# Patient Record
Sex: Female | Born: 1980 | Race: White | Hispanic: No | Marital: Married | Smoking: Never smoker
Health system: Southern US, Community
[De-identification: ages and names within clinical notes are randomized; demographics above are authoritative.]

## PROBLEM LIST (undated history)

## (undated) DIAGNOSIS — N200 Calculus of kidney: Secondary | ICD-10-CM

## (undated) DIAGNOSIS — N943 Premenstrual tension syndrome: Secondary | ICD-10-CM

## (undated) DIAGNOSIS — O020 Blighted ovum and nonhydatidiform mole: Secondary | ICD-10-CM

## (undated) DIAGNOSIS — J45909 Unspecified asthma, uncomplicated: Secondary | ICD-10-CM

## (undated) HISTORY — DX: Premenstrual tension syndrome: N94.3

## (undated) HISTORY — PX: KNEE ARTHROSCOPY: SHX127

## (undated) HISTORY — DX: Calculus of kidney: N20.0

## (undated) HISTORY — PX: PILONIDAL CYST DRAINAGE: SHX743

---
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
## 2001-05-24 ENCOUNTER — Ambulatory Visit (HOSPITAL_COMMUNITY): Admission: RE | Admit: 2001-05-24 | Discharge: 2001-05-24 | Payer: Self-pay | Admitting: General Surgery

## 2001-05-24 ENCOUNTER — Encounter (INDEPENDENT_AMBULATORY_CARE_PROVIDER_SITE_OTHER): Payer: Self-pay | Admitting: Specialist

## 2001-10-06 ENCOUNTER — Emergency Department (HOSPITAL_COMMUNITY): Admission: EM | Admit: 2001-10-06 | Discharge: 2001-10-06 | Payer: Self-pay | Admitting: *Deleted

## 2004-04-25 ENCOUNTER — Other Ambulatory Visit: Admission: RE | Admit: 2004-04-25 | Discharge: 2004-04-25 | Payer: Self-pay | Admitting: Obstetrics and Gynecology

## 2005-07-18 ENCOUNTER — Other Ambulatory Visit: Admission: RE | Admit: 2005-07-18 | Discharge: 2005-07-18 | Payer: Self-pay | Admitting: Obstetrics and Gynecology

## 2008-03-19 ENCOUNTER — Inpatient Hospital Stay (HOSPITAL_COMMUNITY): Admission: AD | Admit: 2008-03-19 | Discharge: 2008-03-21 | Payer: Self-pay | Admitting: Obstetrics and Gynecology

## 2010-09-23 ENCOUNTER — Ambulatory Visit
Admission: RE | Admit: 2010-09-23 | Discharge: 2010-09-23 | Disposition: A | Discharge status: Home / Self Care | Payer: BC Managed Care – PPO | Source: Ambulatory Visit | Attending: Internal Medicine | Admitting: Internal Medicine

## 2010-09-23 ENCOUNTER — Other Ambulatory Visit: Payer: Self-pay | Admitting: Internal Medicine

## 2010-09-23 DIAGNOSIS — R059 Cough, unspecified: Secondary | ICD-10-CM

## 2010-09-23 DIAGNOSIS — R05 Cough: Secondary | ICD-10-CM

## 2010-12-27 NOTE — Discharge Summary (Signed)
Mercedes Diaz, Mercedes Diaz                ACCOUNT NO.:  [id number]   MEDICAL RECORD NO.:  [id number]          PATIENT TYPE:  INP   LOCATION:  9119                          FACILITY:  [facility]   PHYSICIAN:  Huel Cote, M.D. DATE OF BIRTH:  January 13, 1981   DATE OF ADMISSION:  03/19/2008  DATE OF DISCHARGE:  03/21/2008                               DISCHARGE SUMMARY   DISCHARGE DIAGNOSES:  1. Term pregnancy at 40 weeks delivery.  2. Status post normal spontaneous vaginal delivery.   DISCHARGE MEDICATIONS:  1. Tylenol p.r.n. pain.  2. Prenatal vitamins 1 p.o. daily.   DISCHARGE FOLLOWUP:  The patient is to follow up in the office in 6  weeks for her routine postpartum exam.   The patient is a 30 year old G1, P0, who came in at 40 plus weeks for  elective induction due to favorable cervix.  Her estimated date has been  March 18, 2008, by good dating, and her prenatal care was otherwise  uncomplicated.   Prenatal labs are as follows.  A positive, antibody negative, RPR  nonreactive, rubella immune, hepatitis B surface antigen, negative HIV,  negative GC, negative Chlamydia negative, 1-hour glucose tolerance 141  and 3-hour within normal limits.  Group B strep negative.   PAST MEDICAL HISTORY:  Nephrolithiasis.   PAST SURGICAL HISTORY:  Pilonidal cyst surgery x2 and a left knee  arthroscopy x2.   ALLERGIES:  Included IBUPROFEN which caused bloody stools.   MEDICATIONS:  None.   On physical exam, she was afebrile, stable vital signs.  Cervix was 3-4,  70, and -1 station.  She had rupture of membranes performed with clear  fluid noted and was placed on Pitocin.  She continued to progress and  reached complete dilation, pushed well, and had a normal spontaneous  vaginal delivery of a viable female infant.  Apgars were 9 and 9.  Weight  was 7 pounds 12 ounces.  Placenta delivered spontaneously.  Secondary  perineal laceration was repaired with 3-0 Vicryl and a left vaginal  laceration.  She  was then admitted for routine postpartum care and did  quite well.  On postpartum day #1, her hemoglobin was 11.3.  On  postpartum day #2, she was stable, tolerating her pain with Tylenol only  and was felt stable for discharge home.     Huel Cote, M.D.  Electronically Signed    KR/MEDQ  D:  03/21/2008  T:  03/22/2008  Job:  [id number]

## 2010-12-30 NOTE — Discharge Summary (Signed)
[facility]  Patient:    Mercedes Diaz, Mercedes Diaz                      MRN: [id number] Adm. Date:  [id number] Disc. Date: [id number] Attending:  Tammi Sou Dictator:   Ocie Doyne, M.D. CC:         Mardene Celeste. Lurene Shadow, M.D.   Discharge Summary  DATE OF BIRTH:  10-11-1980.  DISCHARGE DIAGNOSIS:  Pilonidal cyst without abscess.  DISCHARGE MEDICATIONS:  Augmentin 250 mg t.i.d. x 5 days and hydrocodone 1-2 tabs q.4-6h. p.r.n. pain.  PROCEDURES:  Excision and drainage of pilonidal abscess.  ADMISSION HISTORY AND PHYSICAL:  This 30 year old female presented today with maternity admission, complaining of sudden onset of right-sided pain, so severe that she had to lie down on the floor.  She vomited x 1.  She denied vaginal discharge.  Her mother has a history of repeated kidney stones and she stated that the pain was somewhat better than it had been earlier that morning.  PAST MEDICAL HISTORY:  None.  She has never had a pelvic exam.  MEDICATIONS:  Advil p.r.n.  ALLERGIES:  No known drug allergies.  SOCIAL HISTORY:  She smokes half a pack a day, occasional alcohol.  Denies illicit drug use.  She is a Consulting civil engineer at [employer] and it has been one year since her last sexual activity.  PHYSICAL EXAMINATION:  VITAL SIGNS:  Temperature 96.9, pulse 82, respirations 18, blood pressure 111/72.  GENERAL:  This is an adult white female in no acute distress.  HEENT:  Pupils are equal, round and reactive to light, accommodating. Tympanic membranes clear bilaterally.  LUNGS:  Clear to auscultation bilaterally.  NECK:  Supple, no lymphadenopathy.  HEART:  Regular rate and rhythm, no murmur.  BACK:  Right CVA tenderness.  There was a cyst in the pilonidal area which the patient stated had been there for a long time.  ABDOMEN:  Soft with mild midline tenderness.  PELVIC:  External genitalia with dried discharge, vagina small amount of  white discharge.  Cervix clear, no cervical motion tenderness, no adnexal tenderness, no masses.  Uterus was nontender.  RECTAL EXAM:  Deferred.  SKIN:  At the top of the buttocks there was a draining malodorous cyst, 1.5 to 2 cm in diameter.  ADMISSION LABORATORY DATA:  A urine pregnancy test was negative.  HOSPITAL COURSE:  [name] was admitted for evaluation of possible urolithiasis and hydronephrosis.  She continued to have sharp right flank pain.  Her urine was strained.  She denied a feeling of passing a stone while urinating and no stones were recovered from the urine.  She denied any dysuria.  Urinalysis showed large amounts of hemoglobin and was positive for nitrites and leukocyte esterase.  Her pain was adequately controlled on oral pain medication. She remained afebrile and stated she was interested in following up with her mothers urologist, who had been treating her mother for renal stones in the past.  While in the hospital she underwent drainage of her pilonidal cyst, as well, without complication.  CONDITION ON DISCHARGE:  Good.  FOLLOW-UP:  Follow up will be with Dr. Lurene Shadow.  The patient will schedule that appointment.  Additionally, she will schedule an appointment with her preferred urologist within the next month. DD:  06/27/00 TD:  06/28/00 Job: [id number]

## 2010-12-30 NOTE — Op Note (Signed)
[place]  Patient:    Mercedes Diaz, Mercedes Diaz Visit Number: [id number] MRN: [id number]          Service Type: DSU Location: [facility] 2899 [id number] Attending Physician:  Sonda Primes Dictated by:   Mardene Celeste Lurene Shadow, M.D. Proc. Date: 05/24/01 Admit Date:  05/24/2001 Discharge Date: 05/24/2001                             Operative Report  PREOPERATIVE DIAGNOSIS:  Recurrent pilonidal cyst.  POSTOPERATIVE DIAGNOSIS:  Recurrent pilonidal cyst.  PROCEDURE:  Excision and marsupialization of recurrent pilonidal cyst.  SURGEON:  Luisa Hart L. Lurene Shadow, M.D.  ASSISTANT:  Nurse.  ANESTHESIA:  General.  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  She initially did well, subsequently had recurrent drainage in the presacral region.  This was treated conservatively; however, she continued to have recurrent infection.  She was brought back to the operating room now for re-excision of her pilonidal cyst.  DESCRIPTION OF PROCEDURE:  Following the induction of satisfactory anesthesia, patient positioned in a prone jackknife position and the perianal tissues were prepped and draped to be included in the sterile operative field.  I used an electrocautery machine to excise a long pilonidal cyst and sinus, which extended from the tip of the coccyx up to the midsacrum.  The sinus and cyst were excised in their entirety.  The base of the wound was then curetted and the dermis was marsupialized down to presacral fascia using multiple interrupted sutures of 0 chromic catgut.  The wound was then packed with Xeroform gauze, sterile dressings applied, the anesthetic reversed, and the patient removed from the operating room to the recovery room in stable condition.  She tolerated the procedure well. Dictated by:   Mardene Celeste. Lurene Shadow, M.D. Attending Physician:  Sonda Primes DD:  05/24/01 TD:  05/24/01 Job: [id number]

## 2011-05-12 LAB — CBC
HCT: 37.4
Hemoglobin: 12.6
MCHC: 33.7
MCHC: 34.3
MCV: 88.1
MCV: 88.8
Platelets: 191
RBC: 3.73 — ABNORMAL LOW
RBC: 4.21
RDW: 14.3
WBC: 8.9

## 2011-05-12 LAB — CCBB MATERNAL DONOR DRAW

## 2011-11-01 ENCOUNTER — Ambulatory Visit
Admission: RE | Admit: 2011-11-01 | Discharge: 2011-11-01 | Disposition: A | Discharge status: Home / Self Care | Payer: BC Managed Care – PPO | Source: Ambulatory Visit | Attending: Family Medicine | Admitting: Family Medicine

## 2011-11-01 ENCOUNTER — Other Ambulatory Visit: Payer: Self-pay | Admitting: Family Medicine

## 2011-11-01 DIAGNOSIS — H539 Unspecified visual disturbance: Secondary | ICD-10-CM

## 2011-11-01 MED ORDER — IOHEXOL 300 MG/ML  SOLN
75.0000 mL | Freq: Once | INTRAMUSCULAR | Status: AC | PRN
  Administered 2011-11-01: 75 mL via INTRAVENOUS

## 2013-03-13 ENCOUNTER — Encounter (HOSPITAL_COMMUNITY): Payer: Self-pay | Admitting: Obstetrics and Gynecology

## 2013-03-14 ENCOUNTER — Encounter (HOSPITAL_COMMUNITY): Payer: Self-pay | Admitting: Anesthesiology

## 2013-03-14 ENCOUNTER — Ambulatory Visit (HOSPITAL_COMMUNITY): Payer: BC Managed Care – PPO | Admitting: Anesthesiology

## 2013-03-14 ENCOUNTER — Encounter (HOSPITAL_COMMUNITY)
Admission: RE | Disposition: A | Discharge status: Home / Self Care | Payer: Self-pay | Source: Ambulatory Visit | Attending: Obstetrics and Gynecology

## 2013-03-14 ENCOUNTER — Encounter (HOSPITAL_COMMUNITY): Payer: Self-pay | Admitting: Pharmacy Technician

## 2013-03-14 ENCOUNTER — Ambulatory Visit (HOSPITAL_COMMUNITY)
Admission: RE | Admit: 2013-03-14 | Discharge: 2013-03-14 | Disposition: A | Discharge status: Home / Self Care | Payer: BC Managed Care – PPO | Source: Ambulatory Visit | Attending: Obstetrics and Gynecology | Admitting: Obstetrics and Gynecology

## 2013-03-14 ENCOUNTER — Encounter (HOSPITAL_COMMUNITY): Payer: Self-pay | Admitting: *Deleted

## 2013-03-14 DIAGNOSIS — O021 Missed abortion: Secondary | ICD-10-CM

## 2013-03-14 HISTORY — PX: DILATION AND EVACUATION: SHX1459

## 2013-03-14 HISTORY — DX: Unspecified asthma, uncomplicated: J45.909

## 2013-03-14 LAB — CBC
HCT: 40.3 % (ref 36.0–46.0)
Hemoglobin: 13.6 g/dL (ref 12.0–15.0)
MCH: 28.8 pg (ref 26.0–34.0)
MCHC: 33.7 g/dL (ref 30.0–36.0)
MCV: 85.2 fL (ref 78.0–100.0)
Platelets: 230 10*3/uL (ref 150–400)
RBC: 4.73 MIL/uL (ref 3.87–5.11)
RDW: 13.7 % (ref 11.5–15.5)
WBC: 6.5 10*3/uL (ref 4.0–10.5)

## 2013-03-14 SURGERY — DILATION AND EVACUATION, UTERUS
Anesthesia: Monitor Anesthesia Care | Site: Vagina | Wound class: Clean Contaminated

## 2013-03-14 MED ORDER — ONDANSETRON HCL 4 MG/2ML IJ SOLN
INTRAMUSCULAR | Status: DC | PRN
  Administered 2013-03-14: 4 mg via INTRAVENOUS

## 2013-03-14 MED ORDER — 0.9 % SODIUM CHLORIDE (POUR BTL) OPTIME
TOPICAL | Status: DC | PRN
  Administered 2013-03-14: 1000 mL

## 2013-03-14 MED ORDER — LIDOCAINE HCL 2 % IJ SOLN
INTRAMUSCULAR | Status: DC | PRN
  Administered 2013-03-14: 16 mL

## 2013-03-14 MED ORDER — LIDOCAINE HCL (CARDIAC) 20 MG/ML IV SOLN
INTRAVENOUS | Status: AC
  Filled 2013-03-14: qty 5

## 2013-03-14 MED ORDER — PROPOFOL 10 MG/ML IV EMUL
INTRAVENOUS | Status: AC
  Filled 2013-03-14: qty 20

## 2013-03-14 MED ORDER — FENTANYL CITRATE 0.05 MG/ML IJ SOLN: 25.0000 ug | INTRAMUSCULAR | Status: DC | PRN

## 2013-03-14 MED ORDER — ONDANSETRON HCL 4 MG/2ML IJ SOLN
INTRAMUSCULAR | Status: AC
  Filled 2013-03-14: qty 2

## 2013-03-14 MED ORDER — MIDAZOLAM HCL 2 MG/2ML IJ SOLN
INTRAMUSCULAR | Status: AC
  Filled 2013-03-14: qty 2

## 2013-03-14 MED ORDER — LIDOCAINE HCL 2 % IJ SOLN
INTRAMUSCULAR | Status: AC
  Filled 2013-03-14: qty 20

## 2013-03-14 MED ORDER — PROPOFOL 10 MG/ML IV EMUL
INTRAVENOUS | Status: DC | PRN
  Administered 2013-03-14: 30 mg via INTRAVENOUS
  Administered 2013-03-14: 20 mg via INTRAVENOUS
  Administered 2013-03-14 (×3): 30 mg via INTRAVENOUS

## 2013-03-14 MED ORDER — LACTATED RINGERS IV SOLN
INTRAVENOUS | Status: DC
  Administered 2013-03-14: 09:00:00 via INTRAVENOUS

## 2013-03-14 MED ORDER — FENTANYL CITRATE 0.05 MG/ML IJ SOLN
INTRAMUSCULAR | Status: DC | PRN
  Administered 2013-03-14 (×2): 50 ug via INTRAVENOUS

## 2013-03-14 MED ORDER — MIDAZOLAM HCL 5 MG/5ML IJ SOLN
INTRAMUSCULAR | Status: DC | PRN
  Administered 2013-03-14: 2 mg via INTRAVENOUS

## 2013-03-14 MED ORDER — DOXYCYCLINE HYCLATE 100 MG IV SOLR
100.0000 mg | Freq: Once | INTRAVENOUS | Status: AC
  Administered 2013-03-14: 100 mg via INTRAVENOUS
  Filled 2013-03-14: qty 100

## 2013-03-14 MED ORDER — FENTANYL CITRATE 0.05 MG/ML IJ SOLN
INTRAMUSCULAR | Status: AC
  Filled 2013-03-14: qty 2

## 2013-03-14 MED ORDER — LACTATED RINGERS IV SOLN: INTRAVENOUS | Status: DC

## 2013-03-14 SURGICAL SUPPLY — 19 items
CATH ROBINSON RED A/P 16FR (CATHETERS) ×2 IMPLANT
CLOTH BEACON ORANGE TIMEOUT ST (SAFETY) ×2 IMPLANT
DECANTER SPIKE VIAL GLASS SM (MISCELLANEOUS) ×2 IMPLANT
GLOVE BIO SURGEON STRL SZ8 (GLOVE) ×2 IMPLANT
GLOVE ORTHO TXT STRL SZ7.5 (GLOVE) ×2 IMPLANT
GOWN STRL REIN XL XLG (GOWN DISPOSABLE) ×4 IMPLANT
KIT BERKELEY 1ST TRIMESTER 3/8 (MISCELLANEOUS) ×2 IMPLANT
NEEDLE SPNL 22GX3.5 QUINCKE BK (NEEDLE) ×2 IMPLANT
NS IRRIG 1000ML POUR BTL (IV SOLUTION) ×2 IMPLANT
PACK VAGINAL MINOR WOMEN LF (CUSTOM PROCEDURE TRAY) ×2 IMPLANT
PAD OB MATERNITY 4.3X12.25 (PERSONAL CARE ITEMS) ×2 IMPLANT
PAD PREP 24X48 CUFFED NSTRL (MISCELLANEOUS) ×2 IMPLANT
SET BERKELEY SUCTION TUBING (SUCTIONS) ×2 IMPLANT
SYR CONTROL 10ML LL (SYRINGE) ×2 IMPLANT
TOWEL OR 17X24 6PK STRL BLUE (TOWEL DISPOSABLE) ×4 IMPLANT
VACURETTE 10 RIGID CVD (CANNULA) IMPLANT
VACURETTE 7MM CVD STRL WRAP (CANNULA) IMPLANT
VACURETTE 8 RIGID CVD (CANNULA) ×2 IMPLANT
VACURETTE 9 RIGID CVD (CANNULA) IMPLANT

## 2013-03-14 NOTE — Op Note (Signed)
  Preoperative Diagnosis:  Missed Abortion Postop Diagnosis:  Missed Abortion Procedure:  D&E Anesthesia:  MAC, paracervical block Findings: Cervix was closed, uterus was normal size, abundant products of conception were obtained Specimens: Products of conception sent for routine pathology Estimated blood loss: Minimal Complications: None  Procedure in detail: The patient was taken to the operating room and placed in the dorsosupine position. IV sedation was given and she was placed in mobile stirrups. Perineum and vagina were prepped and draped in the usual sterile fashion, bladder drained with a red Robinson catheter. A Graves speculum was inserted in the vagina. The anterior lip of the cervix was grasped with a single-tooth tenaculum. Paracervical block was then performed with a total of 16 cc of 2% plain lidocaine. Uterus then sounded to 9 cm. Cervix was gradually easily dilated to size 25 dilator. A size 8 curved suction curet was then inserted without difficulty. Suction curettage was return was performed with return of abundant products of conception. Sharp curettage was performed with which revealed good uterine cry in all quadrants and no significant tissue. Suction curettage was performed one more time which revealed minimal blood. The single-tooth tenaculum was removed from the cervix and bleeding was controlled with pressure. All instruments were removed from the vagina. The patient was taken to the recovery in stable condition after tolerating the procedure well. Counts were correct, she received doxycycline at the beginning of the procedure and had PAS hose on throughout the procedure.

## 2013-03-14 NOTE — Transfer of Care (Signed)
Immediate Anesthesia Transfer of Care Note  Patient: Mercedes Diaz  Procedure(s) Performed: Procedure(s): DILATATION AND EVACUATION (N/A)  Patient Location: PACU  Anesthesia Type:MAC  Level of Consciousness: awake  Airway & Oxygen Therapy: Patient Spontanous Breathing  Post-op Assessment: Report given to PACU RN and Post -op Vital signs reviewed and stable  Post vital signs: Reviewed and stable  Complications: No apparent anesthesia complications

## 2013-03-14 NOTE — Anesthesia Postprocedure Evaluation (Signed)
  Anesthesia Post-op Note  Patient: Mercedes Diaz  Procedure(s) Performed: Procedure(s): DILATATION AND EVACUATION (N/A) Patient is awake and responsive. Pain and nausea are reasonably well controlled. Vital signs are stable and clinically acceptable. Oxygen saturation is clinically acceptable. There are no apparent anesthetic complications at this time. Patient is ready for discharge.

## 2013-03-14 NOTE — Interval H&P Note (Signed)
History and Physical Interval Note:  03/14/2013 8:47 AM  Mercedes Diaz  has presented today for surgery, with the diagnosis of missed ab, [id number]  The various methods of treatment have been discussed with the patient and family. After consideration of risks, benefits and other options for treatment, the patient has consented to  Procedure(s): DILATATION AND EVACUATION (N/A) as a surgical intervention .  The patient's history has been reviewed, patient examined, no change in status, stable for surgery.  I have reviewed the patient's chart and labs.  Questions were answered to the patient's satisfaction.     [name]

## 2013-03-14 NOTE — Preoperative (Signed)
Beta Blockers   Reason not to administer Beta Blockers:Not Applicable 

## 2013-03-14 NOTE — H&P (Signed)
Mercedes Diaz is an 32 y.o. female. She was seen in the office yesterday for a new OB workup, unable to hear FHT, u/s confirmed fetal demise.  Had u/s on 7-3, viable IUP at 6 weeks with +FHT.  U/s from yesterday with CRL c/w 7+ weeks, no FHT.  Options discussed, pt wishes to proceed with D&E.  Her blood type is A positive.  Pertinent Gynecological History: OB History: G2, P1011   Menstrual History:  Patient's last menstrual period was 12/11/2012.    Past Medical History  Diagnosis Date  . Asthma     with cold symptoms     Past Surgical History  Procedure Laterality Date  . Knee arthroscopy    . Pilonidal cyst drainage      History reviewed. No pertinent family history.  Social History:  reports that she has never smoked. She has never used smokeless tobacco. She reports that she does not drink alcohol or use illicit drugs.  Allergies:  Allergies  Allergen Reactions  . Ibuprofen Other (See Comments)    Rectal bleeding    Prescriptions prior to admission  Medication Sig Dispense Refill  . BuPROPion HCl (WELLBUTRIN PO) Take 1 tablet by mouth daily.      . Prenatal Vit-Fe Fumarate-FA (PRENATAL MULTIVITAMIN) TABS Take 1 tablet by mouth daily at 12 noon.        Review of Systems  Respiratory: Negative.   Cardiovascular: Negative.   Gastrointestinal: Negative.   Genitourinary: Negative.     Blood pressure 118/70, pulse 86, temperature 98.2 F (36.8 C), temperature source Oral, resp. rate 18, height 5\' 6"  (1.676 m), weight 81.647 kg (180 lb), last menstrual period 12/11/2012, SpO2 98.00%. Physical Exam  Constitutional: She appears well-developed and well-nourished.  Cardiovascular: Normal rate, regular rhythm and normal heart sounds.   No murmur heard. Respiratory: Effort normal and breath sounds normal. No respiratory distress. She has no wheezes.  GI: Soft. She exhibits no distension and no mass. There is no tenderness.  Genitourinary: Vagina normal.  Uterus slightly  enlarged, cervix closed No adnexal mass    Results for orders placed during the hospital encounter of 03/14/13 (from the past 24 hour(s))  CBC     Status: None   Collection Time    03/14/13  8:04 AM      Result Value Range   WBC 6.5  4.0 - 10.5 K/uL   RBC 4.73  3.87 - 5.11 MIL/uL   Hemoglobin 13.6  12.0 - 15.0 g/dL   HCT 16.1  09.6 - 04.5 %   MCV 85.2  78.0 - 100.0 fL   MCH 28.8  26.0 - 34.0 pg   MCHC 33.7  30.0 - 36.0 g/dL   RDW 40.9  81.1 - 91.4 %   Platelets 230  150 - 400 K/uL    No results found.  Assessment/Plan: Missed abortion at 7+ weeks.  Medical and surgical options discussed, pt wishes to proceed with D&E.  Procedure and risks discussed, will proceed with D&E.    Daniyla Pfahler D 03/14/2013, 8:14 AM

## 2013-03-14 NOTE — Anesthesia Preprocedure Evaluation (Signed)
Anesthesia Evaluation  Patient identified by MRN, date of birth, ID band Patient awake    Reviewed: Allergy & Precautions, H&P , Patient's Chart, lab work & pertinent test results, reviewed documented beta blocker date and time   Airway Mallampati: II TM Distance: >3 FB Neck ROM: full    Dental no notable dental hx.    Pulmonary  breath sounds clear to auscultation  Pulmonary exam normal       Cardiovascular Rhythm:regular Rate:Normal     Neuro/Psych    GI/Hepatic   Endo/Other    Renal/GU      Musculoskeletal   Abdominal   Peds  Hematology   Anesthesia Other Findings   Reproductive/Obstetrics                           Anesthesia Physical Anesthesia Plan  ASA: II  Anesthesia Plan: MAC   Post-op Pain Management:    Induction: Intravenous  Airway Management Planned: LMA, Mask and Natural Airway  Additional Equipment:   Intra-op Plan:   Post-operative Plan:   Informed Consent: I have reviewed the patients History and Physical, chart, labs and discussed the procedure including the risks, benefits and alternatives for the proposed anesthesia with the patient or authorized representative who has indicated his/her understanding and acceptance.   Dental Advisory Given  Plan Discussed with: CRNA and Surgeon  Anesthesia Plan Comments:         Anesthesia Quick Evaluation  

## 2013-03-17 ENCOUNTER — Encounter (HOSPITAL_COMMUNITY): Payer: Self-pay | Admitting: Obstetrics and Gynecology

## 2014-08-14 NOTE — L&D Delivery Note (Signed)
Delivery Note At 3:28 PM a viable female was delivered via Vaginal, Spontaneous Delivery (Presentation: vtx; Occiput Posterior).  APGAR: 9, 9; weight pending.   Placenta status: Intact, Spontaneous.  Cord: 3 vessels with the following complications: None.  Anesthesia: Epidural  Episiotomy: None Lacerations: 3rd degree Suture Repair: 2.0 vicryl for EAS, 3-0 Vicryl rapide for remainder Est. Blood Loss (mL): 282  Mom to postpartum.  Baby to Couplet care / Skin to Skin.  Irlene Crudup D 04/26/2015, 4:17 PM

## 2014-09-24 LAB — OB RESULTS CONSOLE GC/CHLAMYDIA
Chlamydia: NEGATIVE
GC PROBE AMP, GENITAL: NEGATIVE

## 2014-09-24 LAB — OB RESULTS CONSOLE RUBELLA ANTIBODY, IGM: RUBELLA: IMMUNE

## 2014-09-24 LAB — OB RESULTS CONSOLE RPR: RPR: NONREACTIVE

## 2014-09-24 LAB — OB RESULTS CONSOLE ABO/RH: RH TYPE: POSITIVE

## 2014-09-24 LAB — OB RESULTS CONSOLE ANTIBODY SCREEN: ANTIBODY SCREEN: NEGATIVE

## 2014-09-24 LAB — OB RESULTS CONSOLE HEPATITIS B SURFACE ANTIGEN: Hepatitis B Surface Ag: NEGATIVE

## 2014-09-24 LAB — OB RESULTS CONSOLE HIV ANTIBODY (ROUTINE TESTING): HIV: NONREACTIVE

## 2015-03-24 LAB — OB RESULTS CONSOLE GBS: STREP GROUP B AG: NEGATIVE

## 2015-04-22 ENCOUNTER — Telehealth (HOSPITAL_COMMUNITY): Payer: Self-pay | Admitting: *Deleted

## 2015-04-22 ENCOUNTER — Encounter (HOSPITAL_COMMUNITY): Payer: Self-pay | Admitting: *Deleted

## 2015-04-22 NOTE — Telephone Encounter (Signed)
Preadmission screen  

## 2015-04-26 ENCOUNTER — Inpatient Hospital Stay (HOSPITAL_COMMUNITY): Payer: BLUE CROSS/BLUE SHIELD | Admitting: Anesthesiology

## 2015-04-26 ENCOUNTER — Encounter (HOSPITAL_COMMUNITY): Payer: Self-pay

## 2015-04-26 ENCOUNTER — Inpatient Hospital Stay (HOSPITAL_COMMUNITY)
Admission: RE | Admit: 2015-04-26 | Discharge: 2015-04-27 | DRG: 989 | Disposition: A | Discharge status: Home / Self Care | Payer: BLUE CROSS/BLUE SHIELD | Source: Ambulatory Visit | Attending: Obstetrics and Gynecology | Admitting: Obstetrics and Gynecology

## 2015-04-26 DIAGNOSIS — J45909 Unspecified asthma, uncomplicated: Secondary | ICD-10-CM | POA: Diagnosis present

## 2015-04-26 DIAGNOSIS — O9952 Diseases of the respiratory system complicating childbirth: Secondary | ICD-10-CM | POA: Diagnosis present

## 2015-04-26 DIAGNOSIS — Z3A39 39 weeks gestation of pregnancy: Secondary | ICD-10-CM | POA: Diagnosis present

## 2015-04-26 DIAGNOSIS — Z3480 Encounter for supervision of other normal pregnancy, unspecified trimester: Secondary | ICD-10-CM

## 2015-04-26 LAB — TYPE AND SCREEN
ABO/RH(D): A POS
ANTIBODY SCREEN: NEGATIVE

## 2015-04-26 LAB — CBC
HCT: 32.5 % — ABNORMAL LOW (ref 36.0–46.0)
HEMOGLOBIN: 10.7 g/dL — AB (ref 12.0–15.0)
MCH: 27.4 pg (ref 26.0–34.0)
MCHC: 32.9 g/dL (ref 30.0–36.0)
MCV: 83.3 fL (ref 78.0–100.0)
PLATELETS: 239 10*3/uL (ref 150–400)
RBC: 3.9 MIL/uL (ref 3.87–5.11)
RDW: 14.5 % (ref 11.5–15.5)
WBC: 9.2 10*3/uL (ref 4.0–10.5)

## 2015-04-26 LAB — ABO/RH: ABO/RH(D): A POS

## 2015-04-26 MED ORDER — OXYTOCIN 40 UNITS IN LACTATED RINGERS INFUSION - SIMPLE MED
1.0000 m[IU]/min | INTRAVENOUS | Status: DC
  Administered 2015-04-26: 2 m[IU]/min via INTRAVENOUS
  Filled 2015-04-26: qty 1000

## 2015-04-26 MED ORDER — MAGNESIUM HYDROXIDE 400 MG/5ML PO SUSP: 30.0000 mL | ORAL | Status: DC | PRN

## 2015-04-26 MED ORDER — OXYTOCIN 40 UNITS IN LACTATED RINGERS INFUSION - SIMPLE MED: 62.5000 mL/h | INTRAVENOUS | Status: DC

## 2015-04-26 MED ORDER — MEASLES, MUMPS & RUBELLA VAC ~~LOC~~ INJ: 0.5000 mL | INJECTION | Freq: Once | SUBCUTANEOUS | Status: DC

## 2015-04-26 MED ORDER — BUPROPION HCL ER (XL) 150 MG PO TB24
150.0000 mg | ORAL_TABLET | Freq: Every day | ORAL | Status: DC
  Administered 2015-04-26: 150 mg via ORAL
  Filled 2015-04-26 (×3): qty 1

## 2015-04-26 MED ORDER — ONDANSETRON HCL 4 MG/2ML IJ SOLN: 4.0000 mg | Freq: Four times a day (QID) | INTRAMUSCULAR | Status: DC | PRN

## 2015-04-26 MED ORDER — LACTATED RINGERS IV SOLN
INTRAVENOUS | Status: DC
  Administered 2015-04-26 (×2): via INTRAVENOUS

## 2015-04-26 MED ORDER — PHENYLEPHRINE 40 MCG/ML (10ML) SYRINGE FOR IV PUSH (FOR BLOOD PRESSURE SUPPORT)
80.0000 ug | PREFILLED_SYRINGE | INTRAVENOUS | Status: DC | PRN
  Filled 2015-04-26: qty 2
  Filled 2015-04-26: qty 20

## 2015-04-26 MED ORDER — OXYCODONE-ACETAMINOPHEN 5-325 MG PO TABS: 1.0000 | ORAL_TABLET | ORAL | Status: DC | PRN

## 2015-04-26 MED ORDER — LANOLIN HYDROUS EX OINT: TOPICAL_OINTMENT | CUTANEOUS | Status: DC | PRN

## 2015-04-26 MED ORDER — BUTORPHANOL TARTRATE 1 MG/ML IJ SOLN: 1.0000 mg | INTRAMUSCULAR | Status: DC | PRN

## 2015-04-26 MED ORDER — INFLUENZA VAC SPLIT QUAD 0.5 ML IM SUSY
0.5000 mL | PREFILLED_SYRINGE | INTRAMUSCULAR | Status: AC
  Administered 2015-04-27: 0.5 mL via INTRAMUSCULAR
  Filled 2015-04-26: qty 0.5

## 2015-04-26 MED ORDER — EPHEDRINE 5 MG/ML INJ
10.0000 mg | INTRAVENOUS | Status: DC | PRN
  Filled 2015-04-26: qty 2

## 2015-04-26 MED ORDER — OXYCODONE-ACETAMINOPHEN 5-325 MG PO TABS
1.0000 | ORAL_TABLET | ORAL | Status: DC | PRN
  Administered 2015-04-26 (×2): 1 via ORAL
  Filled 2015-04-26 (×2): qty 1

## 2015-04-26 MED ORDER — BENZOCAINE-MENTHOL 20-0.5 % EX AERO
1.0000 "application " | INHALATION_SPRAY | CUTANEOUS | Status: DC | PRN
  Administered 2015-04-26: 1 via TOPICAL
  Filled 2015-04-26: qty 56

## 2015-04-26 MED ORDER — LIDOCAINE HCL (PF) 1 % IJ SOLN
INTRAMUSCULAR | Status: DC | PRN
  Administered 2015-04-26 (×2): 4 mL

## 2015-04-26 MED ORDER — ONDANSETRON HCL 4 MG PO TABS: 4.0000 mg | ORAL_TABLET | ORAL | Status: DC | PRN

## 2015-04-26 MED ORDER — TERBUTALINE SULFATE 1 MG/ML IJ SOLN
0.2500 mg | Freq: Once | INTRAMUSCULAR | Status: DC | PRN
  Filled 2015-04-26: qty 1

## 2015-04-26 MED ORDER — IBUPROFEN 600 MG PO TABS: 600.0000 mg | ORAL_TABLET | Freq: Four times a day (QID) | ORAL | Status: DC

## 2015-04-26 MED ORDER — ZOLPIDEM TARTRATE 5 MG PO TABS: 5.0000 mg | ORAL_TABLET | Freq: Every evening | ORAL | Status: DC | PRN

## 2015-04-26 MED ORDER — PNEUMOCOCCAL VAC POLYVALENT 25 MCG/0.5ML IJ INJ
0.5000 mL | INJECTION | INTRAMUSCULAR | Status: AC
  Administered 2015-04-27: 0.5 mL via INTRAMUSCULAR
  Filled 2015-04-26: qty 0.5

## 2015-04-26 MED ORDER — PRENATAL MULTIVITAMIN CH
1.0000 | ORAL_TABLET | Freq: Every day | ORAL | Status: DC
  Administered 2015-04-27: 1 via ORAL
  Filled 2015-04-26: qty 1

## 2015-04-26 MED ORDER — SENNOSIDES-DOCUSATE SODIUM 8.6-50 MG PO TABS
2.0000 | ORAL_TABLET | ORAL | Status: DC
  Administered 2015-04-26: 2 via ORAL
  Filled 2015-04-26: qty 2

## 2015-04-26 MED ORDER — DIPHENHYDRAMINE HCL 50 MG/ML IJ SOLN: 12.5000 mg | INTRAMUSCULAR | Status: DC | PRN

## 2015-04-26 MED ORDER — LACTATED RINGERS IV SOLN
500.0000 mL | INTRAVENOUS | Status: DC | PRN
  Administered 2015-04-26: 1000 mL via INTRAVENOUS
  Administered 2015-04-26: 500 mL via INTRAVENOUS

## 2015-04-26 MED ORDER — DIBUCAINE 1 % RE OINT
1.0000 "application " | TOPICAL_OINTMENT | RECTAL | Status: DC | PRN
  Administered 2015-04-26: 1 via RECTAL
  Filled 2015-04-26: qty 28

## 2015-04-26 MED ORDER — OXYTOCIN BOLUS FROM INFUSION
500.0000 mL | INTRAVENOUS | Status: DC
  Administered 2015-04-26: 500 mL via INTRAVENOUS

## 2015-04-26 MED ORDER — METHYLERGONOVINE MALEATE 0.2 MG PO TABS: 0.2000 mg | ORAL_TABLET | ORAL | Status: DC | PRN

## 2015-04-26 MED ORDER — METHYLERGONOVINE MALEATE 0.2 MG/ML IJ SOLN: 0.2000 mg | INTRAMUSCULAR | Status: DC | PRN

## 2015-04-26 MED ORDER — ONDANSETRON HCL 4 MG/2ML IJ SOLN: 4.0000 mg | INTRAMUSCULAR | Status: DC | PRN

## 2015-04-26 MED ORDER — LIDOCAINE HCL (PF) 1 % IJ SOLN
30.0000 mL | INTRAMUSCULAR | Status: DC | PRN
  Filled 2015-04-26: qty 30

## 2015-04-26 MED ORDER — FENTANYL 2.5 MCG/ML BUPIVACAINE 1/10 % EPIDURAL INFUSION (WH - ANES)
14.0000 mL/h | INTRAMUSCULAR | Status: DC | PRN
  Administered 2015-04-26 (×2): 14 mL/h via EPIDURAL
  Filled 2015-04-26: qty 125

## 2015-04-26 MED ORDER — WITCH HAZEL-GLYCERIN EX PADS
1.0000 | MEDICATED_PAD | CUTANEOUS | Status: DC | PRN
Start: 2015-04-26 — End: 2015-04-27
  Administered 2015-04-26: 1 via TOPICAL

## 2015-04-26 MED ORDER — SIMETHICONE 80 MG PO CHEW: 80.0000 mg | CHEWABLE_TABLET | ORAL | Status: DC | PRN

## 2015-04-26 MED ORDER — DIPHENHYDRAMINE HCL 25 MG PO CAPS: 25.0000 mg | ORAL_CAPSULE | Freq: Four times a day (QID) | ORAL | Status: DC | PRN

## 2015-04-26 MED ORDER — TETANUS-DIPHTH-ACELL PERTUSSIS 5-2.5-18.5 LF-MCG/0.5 IM SUSP: 0.5000 mL | Freq: Once | INTRAMUSCULAR | Status: DC

## 2015-04-26 MED ORDER — CITRIC ACID-SODIUM CITRATE 334-500 MG/5ML PO SOLN: 30.0000 mL | ORAL | Status: DC | PRN

## 2015-04-26 MED ORDER — ACETAMINOPHEN 325 MG PO TABS
650.0000 mg | ORAL_TABLET | ORAL | Status: DC | PRN
  Administered 2015-04-27 (×3): 650 mg via ORAL
  Filled 2015-04-26 (×3): qty 2

## 2015-04-26 MED ORDER — OXYCODONE-ACETAMINOPHEN 5-325 MG PO TABS: 2.0000 | ORAL_TABLET | ORAL | Status: DC | PRN

## 2015-04-26 NOTE — Anesthesia Procedure Notes (Signed)
Epidural Patient location during procedure: OB  Staffing Anesthesiologist: Tiffini Blacksher Performed by: anesthesiologist   Preanesthetic Checklist Completed: patient identified, site marked, surgical consent, pre-op evaluation, timeout performed, IV checked, risks and benefits discussed and monitors and equipment checked  Epidural Patient position: sitting Prep: site prepped and draped and DuraPrep Patient monitoring: continuous pulse ox and blood pressure Approach: midline Location: L3-L4 Injection technique: LOR saline  Needle:  Needle type: Tuohy  Needle gauge: 17 G Needle length: 9 cm and 9 Needle insertion depth: 7 cm Catheter type: closed end flexible Catheter size: 19 Gauge Catheter at skin depth: 12 cm Test dose: negative  Assessment Events: blood not aspirated, injection not painful, no injection resistance, negative IV test and no paresthesia  Additional Notes Patient identified. Risks/Benefits/Options discussed with patient including but not limited to bleeding, infection, nerve damage, paralysis, failed block, incomplete pain control, headache, blood pressure changes, nausea, vomiting, reactions to medication both or allergic, itching and postpartum back pain. Confirmed with bedside nurse the patient's most recent platelet count. Confirmed with patient that they are not currently taking any anticoagulation, have any bleeding history or any family history of bleeding disorders. Patient expressed understanding and wished to proceed. All questions were answered. Sterile technique was used throughout the entire procedure. Please see nursing notes for vital signs. Test dose was given through epidural catheter and negative prior to continuing to dose epidural or start infusion. Warning signs of high block given to the patient including shortness of breath, tingling/numbness in hands, complete motor block, or any concerning symptoms with instructions to call for help. Patient was  given instructions on fall risk and not to get out of bed. All questions and concerns addressed with instructions to call with any issues or inadequate analgesia.      

## 2015-04-26 NOTE — Progress Notes (Signed)
Comfortable with epidural Afeb, VSS FHT- Cat I VE-4/70/-2, vtx, AROM clear Continue pitocin, monitor progress

## 2015-04-26 NOTE — H&P (Signed)
Mercedes Diaz is a 34 y.o. female, G3 P1011, EGA 39+ weeks with [facility] 9-13 presenting for elective induction.  Prenatal care uncomplicated, h/o molar pregnancy.  Maternal Medical History:  Contractions: Frequency: irregular.   Perceived severity is mild.    Fetal activity: Perceived fetal activity is normal.      OB History    Gravida Para Term Preterm AB TAB SAB Ectopic Multiple Living   Past Medical History  Diagnosis Date  . Asthma     with cold symptoms   . Kidney stone   . PMS (premenstrual syndrome)    Past Surgical History  Procedure Laterality Date  . Knee arthroscopy    . Pilonidal cyst drainage    . Dilation and evacuation N/A 03/14/2013    Procedure: DILATATION AND EVACUATION;  Surgeon: Lavina Hamman, MD;  Location: [facility] ORS;  Service: Gynecology;  Laterality: N/A;   Family History: family history is not on file. Social History:  reports that she has never smoked. She has never used smokeless tobacco. She reports that she does not drink alcohol or use illicit drugs.   Prenatal Transfer Tool  Maternal Diabetes: No Genetic Screening: Declined Maternal Ultrasounds/Referrals: Normal Fetal Ultrasounds or other Referrals:  None Maternal Substance Abuse:  No Significant Maternal Medications:  None Significant Maternal Lab Results:  Lab values include: Group B Strep negative Other Comments:  h/o molar pregnancy  Review of Systems  Respiratory: Negative.   Cardiovascular: Negative.     Dilation: 2.5 Effacement (%): 50 Station: -2 Exam by:: dr Jackelyn Knife Blood pressure 126/87, pulse 116, temperature 97.7 F (36.5 C), temperature source Oral, resp. rate 18, height  (1.676 m), weight 101.152 kg (223 lb), last menstrual period 07/21/2014. Maternal Exam:  Uterine Assessment: Contraction strength is mild.  Contraction frequency is irregular.   Abdomen: Patient reports no abdominal tenderness. Estimated fetal weight is 7 1/2 lbs.   Fetal  presentation: vertex  Introitus: Normal vulva. Normal vagina.  Amniotic fluid character: not assessed.  Pelvis: adequate for delivery.   Cervix: Cervix evaluated by digital exam.     Fetal Exam Fetal Monitor Review: Mode: ultrasound.   Baseline rate: 150.  Variability: moderate (6-25 bpm).   Pattern: accelerations present and no decelerations.    Fetal State Assessment: Category I - tracings are normal.     Physical Exam  Vitals reviewed. Constitutional: She appears well-developed and well-nourished.  Neck: Neck supple. No thyromegaly present.  Cardiovascular: Normal rate, regular rhythm and normal heart sounds.   No murmur heard. Respiratory: Effort normal and breath sounds normal. No respiratory distress. She has no wheezes.  GI: Soft.    Prenatal labs: ABO, Rh: A/Positive/-- (02/11 0000) Antibody: Negative (02/11 0000) Rubella: Immune (02/11 0000) RPR: Nonreactive (02/11 0000)  HBsAg: Negative (02/11 0000)  HIV: Non-reactive (02/11 0000)  GBS: Negative (08/10 0000)   Assessment/Plan: IUP at 39+ weeks for elective induction.  Will start pitocin, monitor progress, anticipate SVD.  Will send placenta to pathology for h/o molar pregnancy.   Dino Borntreger D 04/26/2015, 8:34 AM

## 2015-04-26 NOTE — Anesthesia Preprocedure Evaluation (Signed)
Anesthesia Evaluation  Patient identified by MRN, date of birth, ID band Patient awake    Reviewed: Allergy & Precautions, NPO status , Patient's Chart, lab work & pertinent test results  History of Anesthesia Complications Negative for: history of anesthetic complications  Airway Mallampati: II  TM Distance: >3 FB Neck ROM: Full    Dental no notable dental hx. (+) Dental Advisory Given   Pulmonary asthma ,    Pulmonary exam normal breath sounds clear to auscultation       Cardiovascular negative cardio ROS Normal cardiovascular exam Rhythm:Regular Rate:Normal     Neuro/Psych negative neurological ROS  negative psych ROS   GI/Hepatic negative GI ROS, Neg liver ROS,   Endo/Other  negative endocrine ROSobesity  Renal/GU negative Renal ROS  negative genitourinary   Musculoskeletal negative musculoskeletal ROS (+)   Abdominal   Peds negative pediatric ROS (+)  Hematology negative hematology ROS (+)   Anesthesia Other Findings   Reproductive/Obstetrics (+) Pregnancy                             Anesthesia Physical Anesthesia Plan  ASA: II  Anesthesia Plan: Epidural   Post-op Pain Management:    Induction:   Airway Management Planned:   Additional Equipment:   Intra-op Plan:   Post-operative Plan:   Informed Consent: I have reviewed the patients History and Physical, chart, labs and discussed the procedure including the risks, benefits and alternatives for the proposed anesthesia with the patient or authorized representative who has indicated his/her understanding and acceptance.     Plan Discussed with: CRNA  Anesthesia Plan Comments:         Anesthesia Quick Evaluation

## 2015-04-27 LAB — RPR: RPR Ser Ql: NONREACTIVE

## 2015-04-27 MED ORDER — OXYCODONE-ACETAMINOPHEN 5-325 MG PO TABS: 1.0000 | ORAL_TABLET | ORAL | Status: DC | PRN

## 2015-04-27 MED ORDER — OXYCODONE-ACETAMINOPHEN 5-325 MG PO TABS: 1.0000 | ORAL_TABLET | Freq: Four times a day (QID) | ORAL | Status: DC | PRN

## 2015-04-27 NOTE — Discharge Summary (Signed)
Obstetric Discharge Summary Reason for Admission: induction of labor Prenatal Procedures: none Intrapartum Procedures: spontaneous vaginal delivery Postpartum Procedures: none Complications-Operative and Postpartum: 3rd degree perineal laceration HEMOGLOBIN  Date Value Ref Range Status  04/26/2015 10.7* 12.0 - 15.0 g/dL Final   HCT  Date Value Ref Range Status  04/26/2015 32.5* 36.0 - 46.0 % Final    Physical Exam:  General: alert Lochia: appropriate Uterine Fundus: firm   Discharge Diagnoses: Term Pregnancy-delivered  Discharge Information: Date: 04/27/2015 Activity: pelvic rest Diet: routine Medications: Percocet Condition: stable Instructions: refer to practice specific booklet Discharge to: home Follow-up Information    Follow up with Lometa Riggin D, MD. Schedule an appointment as soon as possible for a visit in 6 weeks.   Specialty:  Obstetrics and Gynecology   Contact information:   [address] [place] [address] [phone]       Newborn Data: Live born female  Birth Weight: 7 lb 12.9 oz (3540 g) APGAR: 9, 9  Home with mother.  Ryott Rafferty D 04/27/2015, 9:09 AM

## 2015-04-27 NOTE — Discharge Instructions (Signed)
As per discharge pamphlet OTC stool softeners

## 2015-04-27 NOTE — Progress Notes (Signed)
PPD #1 No problems, wants to go home Afeb, VSS Fundus firm, NT at U-1 Continue routine postpartum care, d/c home later today 

## 2015-04-27 NOTE — Anesthesia Postprocedure Evaluation (Signed)
  Anesthesia Post-op Note  Patient: Mercedes Diaz  Procedure(s) Performed: * No procedures listed *  Patient Location: PACU and Mother/Baby  Anesthesia Type:Epidural  Level of Consciousness: awake, alert , oriented and patient cooperative  Airway and Oxygen Therapy: Patient Spontanous Breathing  Post-op Pain: none  Post-op Assessment: Post-op Vital signs reviewed, Patient's Cardiovascular Status Stable, Respiratory Function Stable, Patent Airway, No signs of Nausea or vomiting, Adequate PO intake, Pain level controlled, No headache, No backache and Patient able to bend at knees              Post-op Vital Signs: Reviewed and stable  Last Vitals:  Filed Vitals:   04/27/15 0630  BP: 110/58  Pulse: 81  Temp: 37.1 C  Resp: 18    Complications: No apparent anesthesia complications

## 2015-04-27 NOTE — Lactation Note (Signed)
This note was copied from the chart of Mercedes Diaz. Lactation Consultation Note Mom BF her 34 yr old for 8 months. States this baby is doing well, needs to re-adjust latch d/t needing to open flange wider. Mom has short shaft nipples. Encouraged stimulation to evert and stimulate nipples prior to latching.  Discussed positioning and obtaining deep latch. Mom encouraged to feed baby 8-12 times/24 hours and with feeding cues. Mom encouraged to do skin-to-skin. Mom encouraged to waken baby for feeds. Referred to Baby and Me Book in Breastfeeding section Pg. 22-23 for position options and Proper latch demonstration. Educated about newborn behavior. [facility] brochure given w/resources, support groups and LC services. Patient Name: Mercedes Margaretmary Prisk [id number] Date: 04/27/2015 Reason for consult: Initial assessment   Maternal Data Has patient been taught Hand Expression?: Yes Does the patient have breastfeeding experience prior to this delivery?: Yes  Feeding Feeding Type: Breast Fed Length of feed: 10 min  LATCH Score/Interventions                      Lactation Tools Discussed/Used Pump Review: Setup, frequency, and cleaning Initiated by:: rn Date initiated:: 04/26/15   Consult Status Consult Status: Follow-up Date: 04/28/15 Follow-up type: In-patient    Donnetta Gillin, Diamond Nickel 04/27/2015, 2:27 AM

## 2015-04-27 NOTE — Progress Notes (Signed)
Pt discharge originally d/c'd d/t pediatrician not discharging baby.  Pediatrician now willing to discharge baby and the on call MD, Dr. Ellyn Hack, notified and gave verbal order to put discharge order back in.  No other changes were made to any other discharge medications or instructions.  Will discharge pt.

## 2015-04-27 NOTE — Progress Notes (Signed)
MOB was referred for history of depression/anxiety.  Referral is screened out by Clinical Social Worker because none of the following criteria appear to apply: -History of anxiety/depression during this pregnancy, or of post-partum depression. - Diagnosis of anxiety and/or depression within last 3 years or -MOB's symptoms are currently being treated with medication and/or therapy.  Please contact the Clinical Social Worker if needs arise or upon MOB request.  

## 2016-05-16 DIAGNOSIS — Z23 Encounter for immunization: Secondary | ICD-10-CM | POA: Diagnosis not present

## 2016-10-19 DIAGNOSIS — F411 Generalized anxiety disorder: Secondary | ICD-10-CM | POA: Diagnosis not present

## 2016-10-26 DIAGNOSIS — F411 Generalized anxiety disorder: Secondary | ICD-10-CM | POA: Diagnosis not present

## 2016-11-02 DIAGNOSIS — F411 Generalized anxiety disorder: Secondary | ICD-10-CM | POA: Diagnosis not present

## 2016-11-16 DIAGNOSIS — F411 Generalized anxiety disorder: Secondary | ICD-10-CM | POA: Diagnosis not present

## 2016-11-23 DIAGNOSIS — F411 Generalized anxiety disorder: Secondary | ICD-10-CM | POA: Diagnosis not present

## 2016-12-14 DIAGNOSIS — F411 Generalized anxiety disorder: Secondary | ICD-10-CM | POA: Diagnosis not present

## 2016-12-26 DIAGNOSIS — M722 Plantar fascial fibromatosis: Secondary | ICD-10-CM | POA: Diagnosis not present

## 2017-01-01 DIAGNOSIS — F411 Generalized anxiety disorder: Secondary | ICD-10-CM | POA: Diagnosis not present

## 2017-01-05 ENCOUNTER — Other Ambulatory Visit: Payer: Self-pay | Admitting: Family Medicine

## 2017-01-05 ENCOUNTER — Ambulatory Visit
Admission: RE | Admit: 2017-01-05 | Discharge: 2017-01-05 | Disposition: A | Discharge status: Home / Self Care | Payer: BLUE CROSS/BLUE SHIELD | Source: Ambulatory Visit | Attending: Family Medicine | Admitting: Family Medicine

## 2017-01-05 DIAGNOSIS — R52 Pain, unspecified: Secondary | ICD-10-CM

## 2017-01-05 DIAGNOSIS — M79671 Pain in right foot: Secondary | ICD-10-CM | POA: Diagnosis not present

## 2017-01-05 DIAGNOSIS — R2232 Localized swelling, mass and lump, left upper limb: Secondary | ICD-10-CM | POA: Diagnosis not present

## 2017-01-05 DIAGNOSIS — M25571 Pain in right ankle and joints of right foot: Secondary | ICD-10-CM | POA: Diagnosis not present

## 2017-01-18 DIAGNOSIS — F411 Generalized anxiety disorder: Secondary | ICD-10-CM | POA: Diagnosis not present

## 2017-02-07 DIAGNOSIS — J069 Acute upper respiratory infection, unspecified: Secondary | ICD-10-CM | POA: Diagnosis not present

## 2017-02-07 DIAGNOSIS — H6692 Otitis media, unspecified, left ear: Secondary | ICD-10-CM | POA: Diagnosis not present

## 2017-02-08 DIAGNOSIS — F411 Generalized anxiety disorder: Secondary | ICD-10-CM | POA: Diagnosis not present

## 2017-03-03 DIAGNOSIS — F411 Generalized anxiety disorder: Secondary | ICD-10-CM | POA: Diagnosis not present

## 2017-03-26 DIAGNOSIS — F411 Generalized anxiety disorder: Secondary | ICD-10-CM | POA: Diagnosis not present

## 2017-04-05 DIAGNOSIS — H5213 Myopia, bilateral: Secondary | ICD-10-CM | POA: Diagnosis not present

## 2017-04-09 DIAGNOSIS — F411 Generalized anxiety disorder: Secondary | ICD-10-CM | POA: Diagnosis not present

## 2017-05-07 DIAGNOSIS — F411 Generalized anxiety disorder: Secondary | ICD-10-CM | POA: Diagnosis not present

## 2017-05-15 DIAGNOSIS — Z23 Encounter for immunization: Secondary | ICD-10-CM | POA: Diagnosis not present

## 2017-05-21 DIAGNOSIS — F411 Generalized anxiety disorder: Secondary | ICD-10-CM | POA: Diagnosis not present

## 2017-06-04 DIAGNOSIS — F411 Generalized anxiety disorder: Secondary | ICD-10-CM | POA: Diagnosis not present

## 2017-06-25 DIAGNOSIS — F411 Generalized anxiety disorder: Secondary | ICD-10-CM | POA: Diagnosis not present

## 2017-07-16 DIAGNOSIS — F411 Generalized anxiety disorder: Secondary | ICD-10-CM | POA: Diagnosis not present

## 2017-07-30 DIAGNOSIS — F411 Generalized anxiety disorder: Secondary | ICD-10-CM | POA: Diagnosis not present

## 2017-08-20 DIAGNOSIS — F411 Generalized anxiety disorder: Secondary | ICD-10-CM | POA: Diagnosis not present

## 2017-09-10 DIAGNOSIS — F411 Generalized anxiety disorder: Secondary | ICD-10-CM | POA: Diagnosis not present

## 2017-10-01 DIAGNOSIS — F411 Generalized anxiety disorder: Secondary | ICD-10-CM | POA: Diagnosis not present

## 2017-10-22 DIAGNOSIS — F411 Generalized anxiety disorder: Secondary | ICD-10-CM | POA: Diagnosis not present

## 2017-10-23 DIAGNOSIS — J019 Acute sinusitis, unspecified: Secondary | ICD-10-CM | POA: Diagnosis not present

## 2017-11-18 ENCOUNTER — Ambulatory Visit (INDEPENDENT_AMBULATORY_CARE_PROVIDER_SITE_OTHER): Payer: BLUE CROSS/BLUE SHIELD

## 2017-11-18 ENCOUNTER — Ambulatory Visit (HOSPITAL_COMMUNITY)
Admission: EM | Admit: 2017-11-18 | Discharge: 2017-11-18 | Disposition: A | Discharge status: Home / Self Care | Payer: BLUE CROSS/BLUE SHIELD | Attending: Internal Medicine | Admitting: Internal Medicine

## 2017-11-18 ENCOUNTER — Encounter (HOSPITAL_COMMUNITY): Payer: Self-pay | Admitting: Emergency Medicine

## 2017-11-18 DIAGNOSIS — S90112A Contusion of left great toe without damage to nail, initial encounter: Secondary | ICD-10-CM

## 2017-11-18 DIAGNOSIS — M79675 Pain in left toe(s): Secondary | ICD-10-CM | POA: Diagnosis not present

## 2017-11-18 MED ORDER — HYDROCODONE-ACETAMINOPHEN 5-325 MG PO TABS: 1.0000 | ORAL_TABLET | Freq: Every evening | ORAL | 0 refills | Status: DC | PRN

## 2017-11-18 NOTE — Discharge Instructions (Addendum)
No fractures on xray  Please apply ice to toe multiple times a day  Please take tylenol during the day or for mild-moderate pain, for nightime/severe pain please take norco- do not drive after taking

## 2017-11-18 NOTE — ED Triage Notes (Signed)
Pt sts left great toe pain since dropping lid on foot

## 2017-11-18 NOTE — ED Provider Notes (Signed)
[facility]    CSN: [id number] Arrival date & time: 11/18/17  1826     History   Chief Complaint Chief Complaint  Patient presents with  . Toe Pain    HPI Mercedes Diaz is a 37 y.o. female presenting today with toe injury.  States that earlier today she dropped a pan lid on her toe.  She had immediate pain and swelling.  She is weightbearing, and tolerating ambulation but states that it does elicit pain.  HPI  Past Medical History:  Diagnosis Date  . Asthma    with cold symptoms   . Kidney stone   . PMS (premenstrual syndrome)     Patient Active Problem List   Diagnosis Date Noted  . Normal pregnancy, repeat 04/26/2015  . SVD (spontaneous vaginal delivery) 04/26/2015  . Missed abortion 03/14/2013    Past Surgical History:  Procedure Laterality Date  . DILATION AND EVACUATION N/A 03/14/2013   Procedure: DILATATION AND EVACUATION;  Surgeon: Lavina Hamman, MD;  Location: [facility];  Service: Gynecology;  Laterality: N/A;  . KNEE ARTHROSCOPY    . PILONIDAL CYST DRAINAGE      OB History    Gravida  3   Para  2   Term  2   Preterm      AB  1   Living  1     SAB  1   TAB      Ectopic      Multiple  0   Live Births  1            Home Medications    Prior to Admission medications   Medication Sig Start Date End Date Taking? Authorizing Provider  buPROPion (WELLBUTRIN XL) 150 MG 24 hr tablet Take 150 mg by mouth daily.    [provider]  HYDROcodone-acetaminophen (NORCO/VICODIN) 5-325 MG tablet Take 1 tablet by mouth at bedtime as needed for severe pain. 11/18/17   Dandrea Widdowson C, PA-C  Prenatal Vit-Fe Fumarate-FA (PRENATAL MULTIVITAMIN) TABS Take 1 tablet by mouth daily at 12 noon.    [provider]    Family History History reviewed. No pertinent family history.  Social History Social History   Tobacco Use  . Smoking status: Never Smoker  . Smokeless tobacco: Never Used  Substance Use Topics  . Alcohol  use: No  . Drug use: No     Allergies   Ibuprofen   Review of Systems Review of Systems  Constitutional: Negative for fatigue and fever.  Gastrointestinal: Negative for nausea and vomiting.  Musculoskeletal: Positive for arthralgias, gait problem, joint swelling and myalgias.  Skin: Positive for color change. Negative for rash and wound.  Neurological: Negative for dizziness, syncope, weakness, light-headedness, numbness and headaches.     Physical Exam Triage Vital Signs ED Triage Vitals [11/18/17 1856]  Enc Vitals Group     BP 137/80     Pulse Rate 93     Resp 18     Temp 98.3 F (36.8 C)     Temp Source Oral     SpO2 100 %     Weight      Height      Head Circumference      Peak Flow      Pain Score      Pain Loc      Pain Edu?      Excl. in GC?    No data found.  Updated Vital Signs BP 137/80 (BP Location:  Right Arm)   Pulse 93   Temp 98.3 F (36.8 C) (Oral)   Resp 18   SpO2 100%   Visual Acuity Right Eye Distance:   Left Eye Distance:   Bilateral Distance:    Right Eye Near:   Left Eye Near:    Bilateral Near:     Physical Exam  Constitutional: She appears well-developed and well-nourished. No distress.  HENT:  Head: Normocephalic and atraumatic.  Eyes: Conjunctivae are normal.  Neck: Neck supple.  Cardiovascular: Normal rate.  Pulmonary/Chest: Effort normal. No respiratory distress.  Musculoskeletal: She exhibits no edema.  Bruising to left great toe.  Mild mild swelling, tenderness to palpation of the toe, nontender to palpation of first through fifth metatarsals as well as lateral and medial malleolus.  Dorsalis pedis 2+.  Neurological: She is alert.  Skin: Skin is warm and dry.  Psychiatric: She has a normal mood and affect.  Nursing note and vitals reviewed.    UC Treatments / Results  Labs (all labs ordered are listed, but only abnormal results are displayed) Labs Reviewed - No data to display  EKG None Radiology Dg Toe  Great Left  Result Date: 11/18/2017 CLINICAL DATA:  Heavy weight dropped on toe with pain, initial encounter EXAM: LEFT GREAT TOE COMPARISON:  None. FINDINGS: There is no evidence of fracture or dislocation. There is no evidence of arthropathy or other focal bone abnormality. Soft tissues are unremarkable. IMPRESSION: No acute abnormality noted. Electronically Signed   By: Alcide CleverMark  Lukens M.D.   On: 11/18/2017 19:21    Procedures Procedures (including critical care time)  Medications Ordered in UC Medications - No data to display   Initial Impression / Assessment and Plan / UC Course  I have reviewed the triage vital signs and the nursing notes.  Pertinent labs & imaging results that were available during my care of the patient were reviewed by me and considered in my medical decision making (see chart for details).     No fracture or dislocation on x-ray.  Likely contusion of toe.  Will recommend taking take anti-inflammatories, icing and resting.  Patient requesting pain medicine, will provide Norco to use only at bedtime. Discussed strict return precautions. Patient verbalized understanding and is agreeable with plan.   Final Clinical Impressions(s) / UC Diagnoses   Final diagnoses:  Contusion of left great toe without damage to nail, initial encounter    ED Discharge Orders        Ordered    HYDROcodone-acetaminophen (NORCO/VICODIN) 5-325 MG tablet  At bedtime PRN     11/18/17 2007       Controlled Substance Prescriptions [place] Controlled Substance Registry consulted? No   Lew DawesWieters, Arend Bahl C, [place]PA-C 11/18/17 2159

## 2017-12-17 DIAGNOSIS — F411 Generalized anxiety disorder: Secondary | ICD-10-CM | POA: Diagnosis not present

## 2018-02-07 DIAGNOSIS — L03115 Cellulitis of right lower limb: Secondary | ICD-10-CM | POA: Diagnosis not present

## 2018-02-07 DIAGNOSIS — W57XXXA Bitten or stung by nonvenomous insect and other nonvenomous arthropods, initial encounter: Secondary | ICD-10-CM | POA: Diagnosis not present

## 2018-02-07 DIAGNOSIS — Z01419 Encounter for gynecological examination (general) (routine) without abnormal findings: Secondary | ICD-10-CM | POA: Diagnosis not present

## 2018-02-07 DIAGNOSIS — Z130 Encounter for screening for diseases of the blood and blood-forming organs and certain disorders involving the immune mechanism: Secondary | ICD-10-CM | POA: Diagnosis not present

## 2018-02-07 DIAGNOSIS — Z1389 Encounter for screening for other disorder: Secondary | ICD-10-CM | POA: Diagnosis not present

## 2018-02-07 DIAGNOSIS — Z3046 Encounter for surveillance of implantable subdermal contraceptive: Secondary | ICD-10-CM | POA: Diagnosis not present

## 2018-02-07 DIAGNOSIS — Z6831 Body mass index (BMI) 31.0-31.9, adult: Secondary | ICD-10-CM | POA: Diagnosis not present

## 2018-05-09 DIAGNOSIS — Z3044 Encounter for surveillance of vaginal ring hormonal contraceptive device: Secondary | ICD-10-CM | POA: Diagnosis not present

## 2018-05-15 DIAGNOSIS — Z23 Encounter for immunization: Secondary | ICD-10-CM | POA: Diagnosis not present

## 2018-09-17 DIAGNOSIS — J019 Acute sinusitis, unspecified: Secondary | ICD-10-CM | POA: Diagnosis not present

## 2019-04-25 ENCOUNTER — Other Ambulatory Visit: Payer: Self-pay | Admitting: *Deleted

## 2019-04-25 DIAGNOSIS — R6889 Other general symptoms and signs: Secondary | ICD-10-CM | POA: Diagnosis not present

## 2019-04-25 DIAGNOSIS — Z20822 Contact with and (suspected) exposure to covid-19: Secondary | ICD-10-CM

## 2019-04-27 LAB — NOVEL CORONAVIRUS, NAA: SARS-CoV-2, NAA: NOT DETECTED

## 2019-05-20 DIAGNOSIS — Z124 Encounter for screening for malignant neoplasm of cervix: Secondary | ICD-10-CM | POA: Diagnosis not present

## 2019-05-20 DIAGNOSIS — Z1151 Encounter for screening for human papillomavirus (HPV): Secondary | ICD-10-CM | POA: Diagnosis not present

## 2019-05-20 DIAGNOSIS — Z130 Encounter for screening for diseases of the blood and blood-forming organs and certain disorders involving the immune mechanism: Secondary | ICD-10-CM | POA: Diagnosis not present

## 2019-05-20 DIAGNOSIS — Z01419 Encounter for gynecological examination (general) (routine) without abnormal findings: Secondary | ICD-10-CM | POA: Diagnosis not present

## 2019-07-22 DIAGNOSIS — Z20828 Contact with and (suspected) exposure to other viral communicable diseases: Secondary | ICD-10-CM | POA: Diagnosis not present

## 2019-07-22 DIAGNOSIS — Z9189 Other specified personal risk factors, not elsewhere classified: Secondary | ICD-10-CM | POA: Diagnosis not present

## 2019-12-01 ENCOUNTER — Other Ambulatory Visit: Payer: BC Managed Care – PPO

## 2019-12-10 DIAGNOSIS — J0110 Acute frontal sinusitis, unspecified: Secondary | ICD-10-CM | POA: Diagnosis not present

## 2019-12-16 DIAGNOSIS — Z1389 Encounter for screening for other disorder: Secondary | ICD-10-CM | POA: Diagnosis not present

## 2019-12-16 DIAGNOSIS — R1013 Epigastric pain: Secondary | ICD-10-CM | POA: Diagnosis not present

## 2019-12-16 DIAGNOSIS — E663 Overweight: Secondary | ICD-10-CM | POA: Diagnosis not present

## 2019-12-18 ENCOUNTER — Other Ambulatory Visit: Payer: Self-pay | Admitting: Internal Medicine

## 2019-12-18 DIAGNOSIS — R1013 Epigastric pain: Secondary | ICD-10-CM

## 2019-12-25 ENCOUNTER — Ambulatory Visit
Admission: RE | Admit: 2019-12-25 | Discharge: 2019-12-25 | Disposition: A | Discharge status: Home / Self Care | Payer: BC Managed Care – PPO | Source: Ambulatory Visit | Attending: Internal Medicine | Admitting: Internal Medicine

## 2019-12-25 DIAGNOSIS — K824 Cholesterolosis of gallbladder: Secondary | ICD-10-CM | POA: Diagnosis not present

## 2019-12-25 DIAGNOSIS — R1013 Epigastric pain: Secondary | ICD-10-CM

## 2020-01-19 DIAGNOSIS — K219 Gastro-esophageal reflux disease without esophagitis: Secondary | ICD-10-CM | POA: Diagnosis not present

## 2020-01-19 DIAGNOSIS — R1013 Epigastric pain: Secondary | ICD-10-CM | POA: Diagnosis not present

## 2020-01-22 DIAGNOSIS — K449 Diaphragmatic hernia without obstruction or gangrene: Secondary | ICD-10-CM | POA: Diagnosis not present

## 2020-01-22 DIAGNOSIS — R1013 Epigastric pain: Secondary | ICD-10-CM | POA: Diagnosis not present

## 2020-02-02 DIAGNOSIS — Z8379 Family history of other diseases of the digestive system: Secondary | ICD-10-CM | POA: Diagnosis not present

## 2020-02-02 DIAGNOSIS — R1013 Epigastric pain: Secondary | ICD-10-CM | POA: Diagnosis not present

## 2020-02-02 DIAGNOSIS — R109 Unspecified abdominal pain: Secondary | ICD-10-CM | POA: Diagnosis not present

## 2020-04-06 DIAGNOSIS — Z0000 Encounter for general adult medical examination without abnormal findings: Secondary | ICD-10-CM | POA: Diagnosis not present

## 2020-04-06 DIAGNOSIS — Z79899 Other long term (current) drug therapy: Secondary | ICD-10-CM | POA: Diagnosis not present

## 2020-04-13 DIAGNOSIS — G43109 Migraine with aura, not intractable, without status migrainosus: Secondary | ICD-10-CM | POA: Diagnosis not present

## 2020-04-14 DIAGNOSIS — Z0000 Encounter for general adult medical examination without abnormal findings: Secondary | ICD-10-CM | POA: Diagnosis not present

## 2020-05-26 DIAGNOSIS — Z1389 Encounter for screening for other disorder: Secondary | ICD-10-CM | POA: Diagnosis not present

## 2020-05-26 DIAGNOSIS — Z6828 Body mass index (BMI) 28.0-28.9, adult: Secondary | ICD-10-CM | POA: Diagnosis not present

## 2020-05-26 DIAGNOSIS — Z130 Encounter for screening for diseases of the blood and blood-forming organs and certain disorders involving the immune mechanism: Secondary | ICD-10-CM | POA: Diagnosis not present

## 2020-05-26 DIAGNOSIS — Z01419 Encounter for gynecological examination (general) (routine) without abnormal findings: Secondary | ICD-10-CM | POA: Diagnosis not present

## 2020-07-02 DIAGNOSIS — M5416 Radiculopathy, lumbar region: Secondary | ICD-10-CM | POA: Diagnosis not present

## 2020-07-16 DIAGNOSIS — M5416 Radiculopathy, lumbar region: Secondary | ICD-10-CM | POA: Diagnosis not present

## 2020-07-20 DIAGNOSIS — M5416 Radiculopathy, lumbar region: Secondary | ICD-10-CM | POA: Diagnosis not present

## 2020-07-23 DIAGNOSIS — M5416 Radiculopathy, lumbar region: Secondary | ICD-10-CM | POA: Diagnosis not present

## 2020-07-27 DIAGNOSIS — M5416 Radiculopathy, lumbar region: Secondary | ICD-10-CM | POA: Diagnosis not present

## 2020-07-30 DIAGNOSIS — M5416 Radiculopathy, lumbar region: Secondary | ICD-10-CM | POA: Diagnosis not present

## 2020-08-03 DIAGNOSIS — M5416 Radiculopathy, lumbar region: Secondary | ICD-10-CM | POA: Diagnosis not present

## 2020-08-05 DIAGNOSIS — M5416 Radiculopathy, lumbar region: Secondary | ICD-10-CM | POA: Diagnosis not present

## 2020-08-10 DIAGNOSIS — M5416 Radiculopathy, lumbar region: Secondary | ICD-10-CM | POA: Diagnosis not present

## 2020-08-12 DIAGNOSIS — M5416 Radiculopathy, lumbar region: Secondary | ICD-10-CM | POA: Diagnosis not present

## 2020-11-25 DIAGNOSIS — J0110 Acute frontal sinusitis, unspecified: Secondary | ICD-10-CM | POA: Diagnosis not present

## 2021-01-13 DIAGNOSIS — H66013 Acute suppurative otitis media with spontaneous rupture of ear drum, bilateral: Secondary | ICD-10-CM | POA: Diagnosis not present

## 2021-01-13 DIAGNOSIS — J0100 Acute maxillary sinusitis, unspecified: Secondary | ICD-10-CM | POA: Diagnosis not present

## 2021-04-14 DIAGNOSIS — Z79899 Other long term (current) drug therapy: Secondary | ICD-10-CM | POA: Diagnosis not present

## 2021-04-14 DIAGNOSIS — Z0000 Encounter for general adult medical examination without abnormal findings: Secondary | ICD-10-CM | POA: Diagnosis not present

## 2021-05-03 DIAGNOSIS — Z1339 Encounter for screening examination for other mental health and behavioral disorders: Secondary | ICD-10-CM | POA: Diagnosis not present

## 2021-05-03 DIAGNOSIS — Z1331 Encounter for screening for depression: Secondary | ICD-10-CM | POA: Diagnosis not present

## 2021-05-03 DIAGNOSIS — Z23 Encounter for immunization: Secondary | ICD-10-CM | POA: Diagnosis not present

## 2021-05-03 DIAGNOSIS — Z0000 Encounter for general adult medical examination without abnormal findings: Secondary | ICD-10-CM | POA: Diagnosis not present

## 2021-06-07 DIAGNOSIS — N841 Polyp of cervix uteri: Secondary | ICD-10-CM | POA: Diagnosis not present

## 2021-06-07 DIAGNOSIS — Z1389 Encounter for screening for other disorder: Secondary | ICD-10-CM | POA: Diagnosis not present

## 2021-06-07 DIAGNOSIS — Z130 Encounter for screening for diseases of the blood and blood-forming organs and certain disorders involving the immune mechanism: Secondary | ICD-10-CM | POA: Diagnosis not present

## 2021-06-07 DIAGNOSIS — Z01419 Encounter for gynecological examination (general) (routine) without abnormal findings: Secondary | ICD-10-CM | POA: Diagnosis not present

## 2021-06-07 DIAGNOSIS — Z6829 Body mass index (BMI) 29.0-29.9, adult: Secondary | ICD-10-CM | POA: Diagnosis not present

## 2021-11-11 IMAGING — US US ABDOMEN LIMITED
1 series · 14 of 25 positions shown · non-contrast
Comparison: None.

CLINICAL DATA: Epigastric pain

EXAM:
ULTRASOUND ABDOMEN LIMITED RIGHT UPPER QUADRANT

[Series 1: us abdomen limited · 0.19mm/px · 14 of 45 slices shown]
[im 1/45]
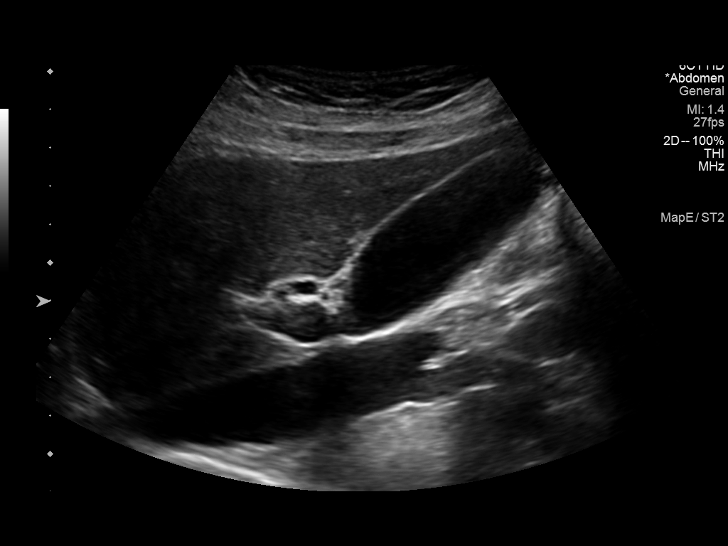
[im 4/45]
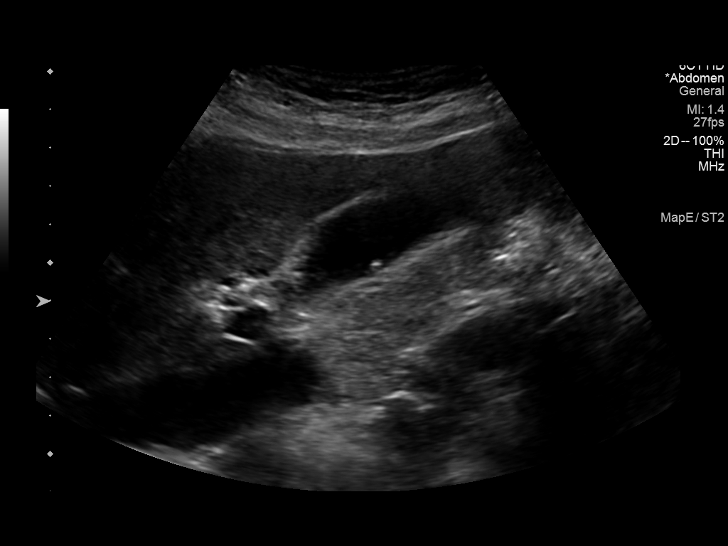
[im 8/45]
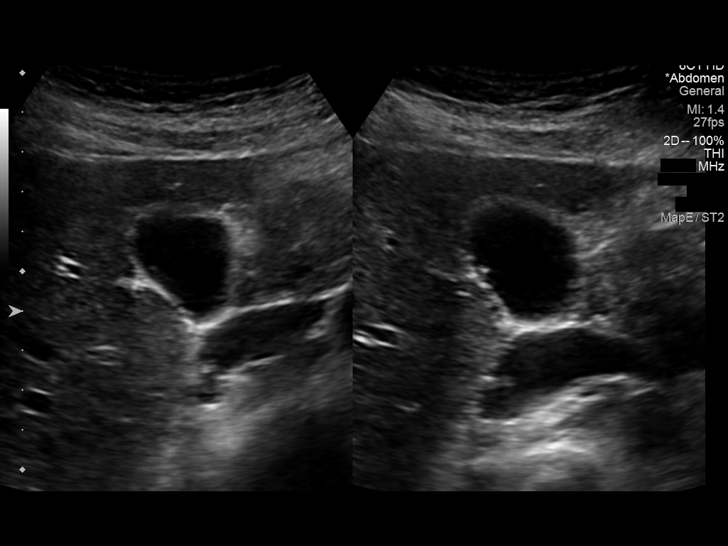
[im 12/45]
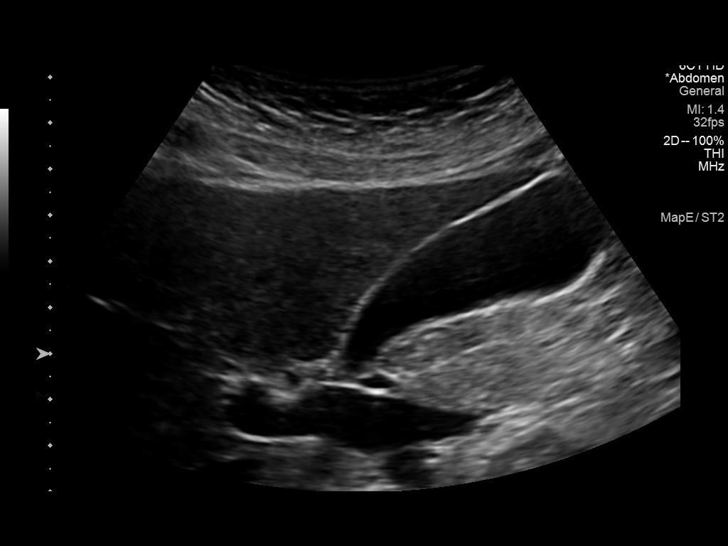
[im 15/45]
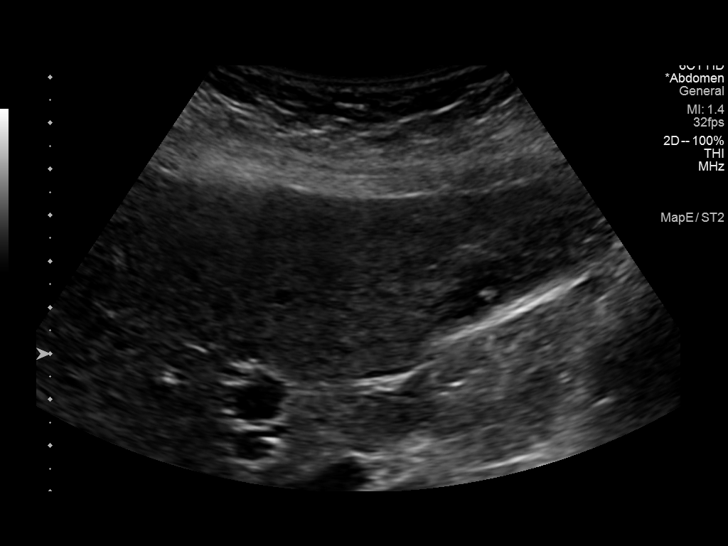
[im 17/45]
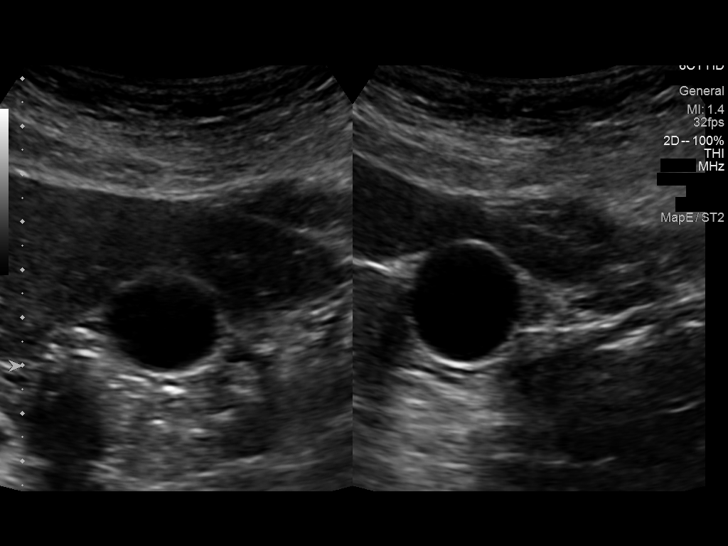
[im 21/45]
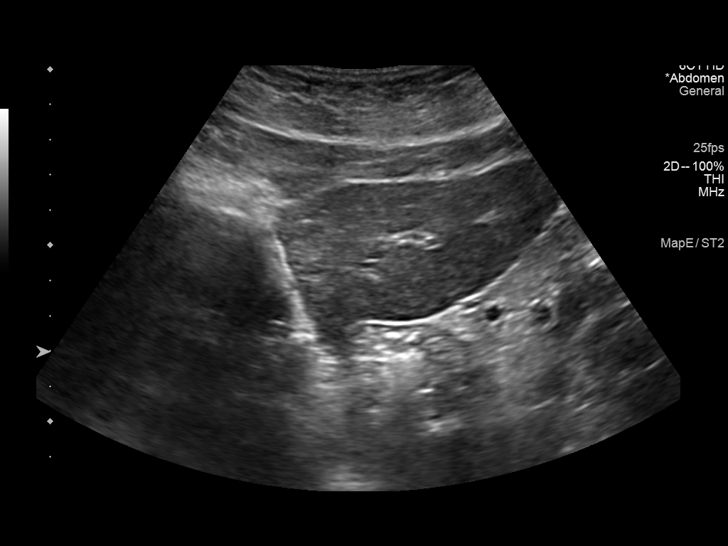
[im 24/45]
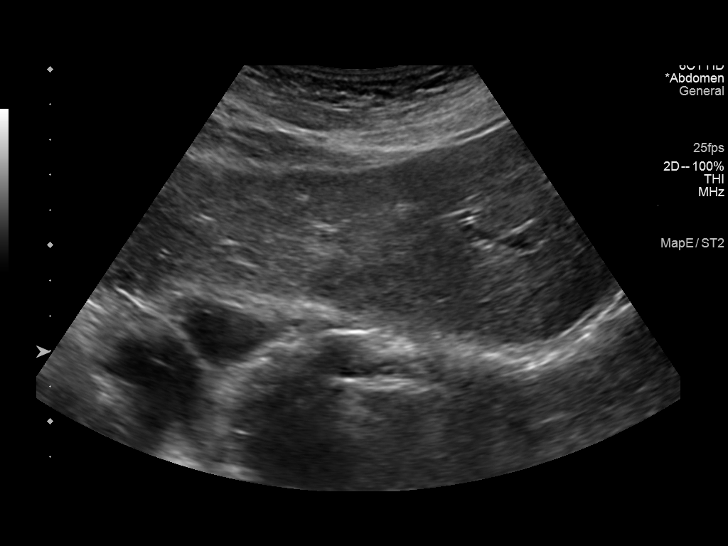
[im 28/45]
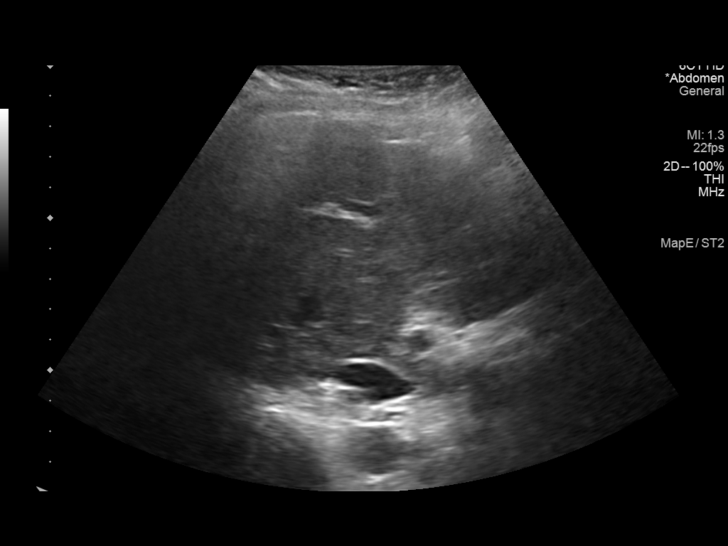
[im 30/45]
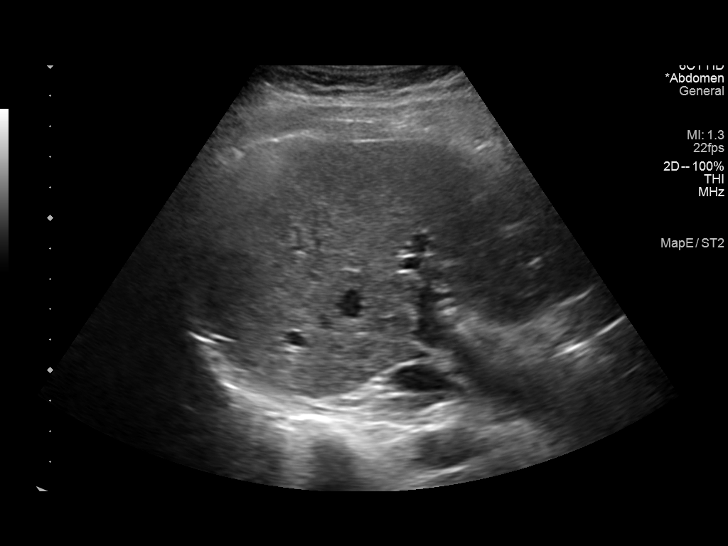
[im 34/45]
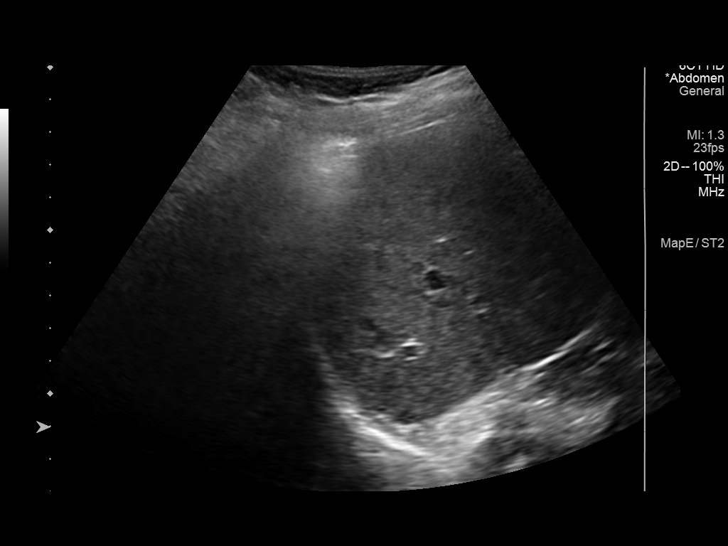
[im 37/45]
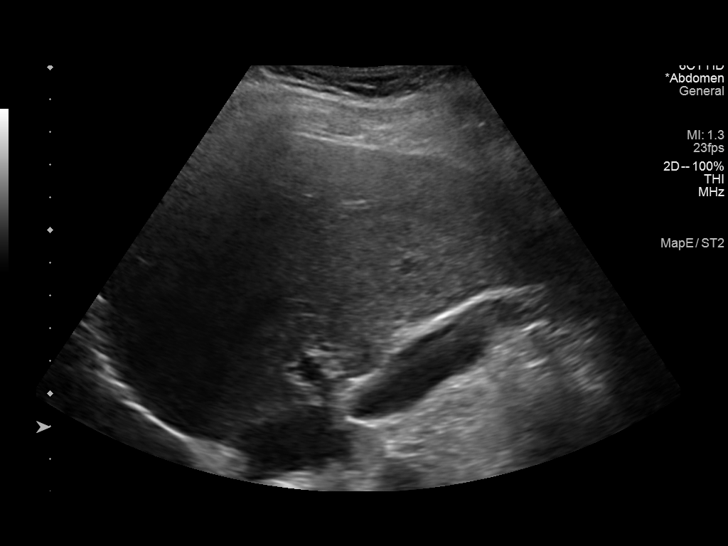
[im 41/45]
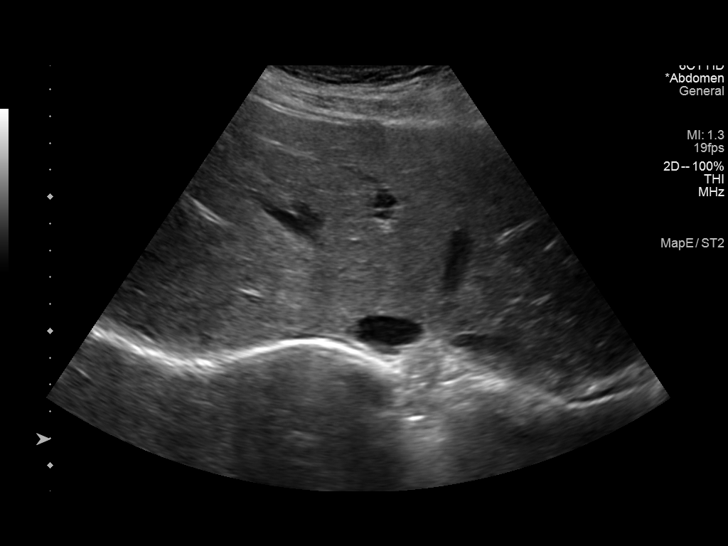
[im 45/45]
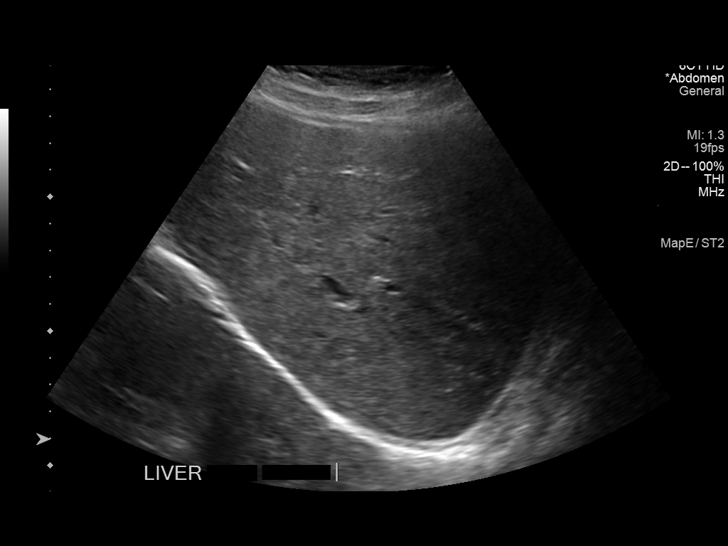

[14 of 25 positions shown; findings below may reference images not displayed]

FINDINGS: Gallbladder:

3 mm polyp in the gallbladder. This is not shadow or move. No
gallbladder wall thickening. Negative sonographic Murphy sign

Common bile duct:

Diameter: 3.2 mm

Liver:

No focal lesion identified. Within normal limits in parenchymal
echogenicity. Portal vein is patent on color Doppler imaging with
normal direction of blood flow towards the liver.

Other: None.
IMPRESSION: 3 mm gallbladder polyp otherwise negative

## 2021-11-30 DIAGNOSIS — R234 Changes in skin texture: Secondary | ICD-10-CM | POA: Diagnosis not present

## 2021-11-30 DIAGNOSIS — Z1339 Encounter for screening examination for other mental health and behavioral disorders: Secondary | ICD-10-CM | POA: Diagnosis not present

## 2022-01-23 DIAGNOSIS — M778 Other enthesopathies, not elsewhere classified: Secondary | ICD-10-CM | POA: Diagnosis not present

## 2022-04-06 NOTE — Progress Notes (Signed)
[facility] [address] [place] [address] Phone: [phone] Subjective:   Bruce Donath, am serving as a scribe for Dr. Antoine Primas.  I'm seeing this patient by the request  of:  Patient, No Pcp Per  CC: Knee pain  [id number]  Mercedes Diaz is a 41 y.o. female coming in with complaint of l knee pain. Patient states that 20 years ago she tore medial PF ligament and had some cartilage damage. Patient is trying to run and knee will swell. Feels pressure throughout joint line. Patient has tried RICE.  States that sometimes the pain can be severe enough that it is more Uncomfortable at night.  Worse with stairs.      Past Medical History:  Diagnosis Date   Asthma    with cold symptoms    Kidney stone    PMS (premenstrual syndrome)    Past Surgical History:  Procedure Laterality Date   DILATION AND EVACUATION N/A 03/14/2013   Procedure: DILATATION AND EVACUATION;  Surgeon: Lavina Hamman, MD;  Location: [facility];  Service: Gynecology;  Laterality: N/A;   KNEE ARTHROSCOPY     PILONIDAL CYST DRAINAGE     Social History   Socioeconomic History   Marital status: Married    Spouse name: Not on file   Number of children: Not on file   Years of education: Not on file   Highest education level: Not on file  Occupational History   Not on file  Tobacco Use   Smoking status: Never   Smokeless tobacco: Never  Substance and Sexual Activity   Alcohol use: No   Drug use: No   Sexual activity: Yes    Birth control/protection: None  Other Topics Concern   Not on file  Social History Narrative   Not on file   Social Determinants of Health   Financial Resource Strain: Not on file  Food Insecurity: Not on file  Transportation Needs: Not on file  Physical Activity: Not on file  Stress: Not on file  Social Connections: Not on file   Allergies  Allergen Reactions   Ibuprofen Other (See Comments)    Rectal bleeding   No family  history on file.     Current Outpatient Medications (Analgesics):    HYDROcodone-acetaminophen (NORCO/VICODIN) 5-325 MG tablet, Take 1 tablet by mouth at bedtime as needed for severe pain.   Current Outpatient Medications (Other):    buPROPion (WELLBUTRIN XL) 150 MG 24 hr tablet, Take 150 mg by mouth daily.   Prenatal Vit-Fe Fumarate-FA (PRENATAL MULTIVITAMIN) TABS, Take 1 tablet by mouth daily at 12 noon.   Reviewed prior external information including notes and imaging from  primary care provider As well as notes that were available from care everywhere and other healthcare systems.  Past medical history, social, surgical and family history all reviewed in electronic medical record.  No pertanent information unless stated regarding to the chief complaint.   Review of Systems:  No headache, visual changes, nausea, vomiting, diarrhea, constipation, dizziness, abdominal pain, skin rash, fevers, chills, night sweats, weight loss, swollen lymph nodes, body aches, joint swelling, chest pain, shortness of breath, mood changes. POSITIVE muscle aches  Objective  Blood pressure 110/80, pulse 92, height 5\' 6"  (1.676 m), weight 183 lb (83 kg), SpO2 98 %, unknown if currently breastfeeding.   General: No apparent distress alert and oriented x3 mood and affect normal, dressed appropriately.  HEENT: Pupils equal, extraocular movements intact  Respiratory: Patient's speak  in full sentences and does not appear short of breath  Cardiovascular: No lower extremity edema, non tender, no erythema  Knee exam shows patient does have a scar from previous surgery.  Patient does have tenderness to palpation over the patellofemoral joint left greater than right.  Mild crepitus noted as well.   Limited muscular skeletal ultrasound was performed and interpreted by Antoine Primas, M  Limited ultrasound shows the patient does have narrowing of the medial joint line as well as in the patellofemoral joint.   Patient has trace effusion noted with hypoechoic changes in the patellofemoral area.  Patient does have what appears to be postsurgical changes also noted of the medial meniscus. 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   Impression and Recommendations:     The above documentation has been reviewed and is accurate and complete Judi Saa, DO

## 2022-04-07 ENCOUNTER — Encounter: Payer: Self-pay | Admitting: Family Medicine

## 2022-04-07 ENCOUNTER — Ambulatory Visit (INDEPENDENT_AMBULATORY_CARE_PROVIDER_SITE_OTHER): Payer: BC Managed Care – PPO

## 2022-04-07 ENCOUNTER — Ambulatory Visit: Payer: Self-pay

## 2022-04-07 ENCOUNTER — Ambulatory Visit: Payer: BC Managed Care – PPO | Admitting: Family Medicine

## 2022-04-07 VITALS — BP 110/80 | HR 92 | Ht 66.0 in | Wt 183.0 lb

## 2022-04-07 DIAGNOSIS — M25562 Pain in left knee: Secondary | ICD-10-CM

## 2022-04-07 DIAGNOSIS — G8929 Other chronic pain: Secondary | ICD-10-CM | POA: Diagnosis not present

## 2022-04-07 DIAGNOSIS — M222X2 Patellofemoral disorders, left knee: Secondary | ICD-10-CM

## 2022-04-07 NOTE — Patient Instructions (Addendum)
Brace  Xray today Vit D 2000iu Ice after activity

## 2022-04-07 NOTE — Assessment & Plan Note (Signed)
Reviewed anatomy using anatomical model and how PFS occurs.  Given rehab exercises handout for VMO, hip abductors, core, entire kinetic chain including proprioception exercises.  Could benefit from PT, regular exercise, upright biking, and a PFS knee brace to assist with tracking abnormalities. Worsening pain will consider formal physical therapy and injection

## 2022-05-11 DIAGNOSIS — R7989 Other specified abnormal findings of blood chemistry: Secondary | ICD-10-CM | POA: Diagnosis not present

## 2022-05-11 DIAGNOSIS — Z0000 Encounter for general adult medical examination without abnormal findings: Secondary | ICD-10-CM | POA: Diagnosis not present

## 2022-05-11 DIAGNOSIS — E669 Obesity, unspecified: Secondary | ICD-10-CM | POA: Diagnosis not present

## 2022-05-16 DIAGNOSIS — Z23 Encounter for immunization: Secondary | ICD-10-CM | POA: Diagnosis not present

## 2022-05-16 DIAGNOSIS — Z1339 Encounter for screening examination for other mental health and behavioral disorders: Secondary | ICD-10-CM | POA: Diagnosis not present

## 2022-05-16 DIAGNOSIS — Z1331 Encounter for screening for depression: Secondary | ICD-10-CM | POA: Diagnosis not present

## 2022-05-16 DIAGNOSIS — Z0000 Encounter for general adult medical examination without abnormal findings: Secondary | ICD-10-CM | POA: Diagnosis not present

## 2022-05-19 NOTE — Progress Notes (Signed)
[facility] [address] [place] Phone: [phone] Subjective:   IVilma Meckel, am serving as a scribe for Dr. Hulan Saas.  I'm seeing this patient by the request  of:  Patient, No Pcp Per  CC: Knee pain, foot pain  [id number]  04/07/2022 Reviewed anatomy using anatomical model and how PFS occurs.   Given rehab exercises handout for VMO, hip abductors, core, entire kinetic chain including proprioception exercises.   Could benefit from PT, regular exercise, upright biking, and a PFS knee brace to assist with tracking abnormalities. Worsening pain will consider formal physical therapy and injection  Update 05/22/2022 Mercedes Diaz is a 41 y.o. female coming in with complaint of L knee pain. Patient states knee is about the same. Wants you to take a look at great toe on left foot. About 5 years ago injured it and hasn't been able to flex it since. Recently stubbed it and is painful.      Past Medical History:  Diagnosis Date   Asthma    with cold symptoms    Kidney stone    PMS (premenstrual syndrome)    Past Surgical History:  Procedure Laterality Date   DILATION AND EVACUATION N/A 03/14/2013   Procedure: DILATATION AND EVACUATION;  Surgeon: Cheri Fowler, MD;  Location: [facility];  Service: Gynecology;  Laterality: N/A;   KNEE ARTHROSCOPY     PILONIDAL CYST DRAINAGE     Social History   Socioeconomic History   Marital status: Married    Spouse name: Not on file   Number of children: Not on file   Years of education: Not on file   Highest education level: Not on file  Occupational History   Not on file  Tobacco Use   Smoking status: Never   Smokeless tobacco: Never  Substance and Sexual Activity   Alcohol use: No   Drug use: No   Sexual activity: Yes    Birth control/protection: None  Other Topics Concern   Not on file  Social History Narrative   Not on file   Social Determinants of Health   Financial  Resource Strain: Not on file  Food Insecurity: Not on file  Transportation Needs: Not on file  Physical Activity: Not on file  Stress: Not on file  Social Connections: Not on file   Allergies  Allergen Reactions   Ibuprofen Other (See Comments)    Rectal bleeding   No family history on file.     Current Outpatient Medications (Analgesics):    HYDROcodone-acetaminophen (NORCO/VICODIN) 5-325 MG tablet, Take 1 tablet by mouth at bedtime as needed for severe pain.   Current Outpatient Medications (Other):    buPROPion (WELLBUTRIN XL) 150 MG 24 hr tablet, Take 150 mg by mouth daily.   Prenatal Vit-Fe Fumarate-FA (PRENATAL MULTIVITAMIN) TABS, Take 1 tablet by mouth daily at 12 noon.    Review of Systems:  No headache, visual changes, nausea, vomiting, diarrhea, constipation, dizziness, abdominal pain, skin rash, fevers, chills, night sweats, weight loss, swollen lymph nodes, body aches, joint swelling, chest pain, shortness of breath, mood changes. POSITIVE muscle aches  Objective  Blood pressure 112/78, pulse 81, height 5\' 6"  (1.676 m), weight 183 lb (83 kg), SpO2 98 %, unknown if currently breastfeeding.   General: No apparent distress alert and oriented x3 mood and affect normal, dressed appropriately.  HEENT: Pupils equal, extraocular movements intact  Respiratory: Patient's speak in full sentences and does not appear short of  breath  Cardiovascular: No lower extremity edema, non tender, no erythema  Left knee still has significant crepitus noted.  Patient does have lateral tracking of the patella noted.  Patient does have some tenderness to palpation  Left foot exam shows the patient's first toe does not flex at this moment.  This seems to be in the IP joint.  Patient is nontender over any of the joints.  No significant swelling noted.  Limited muscular skeletal ultrasound was performed and interpreted by Antoine Primas, M  Limited ultrasound of patient's left knee shows a  trace effusion with hypoechoic changes noted within the patellofemoral joint.  Does have narrowing of the patellofemoral joint noted as well. Impression: Patellofemoral arthritis, mild with small effusion.    Impression and Recommendations:    The above documentation has been reviewed and is accurate and complete Judi Saa, DO

## 2022-05-22 ENCOUNTER — Ambulatory Visit: Payer: Self-pay

## 2022-05-22 ENCOUNTER — Ambulatory Visit: Payer: BC Managed Care – PPO | Admitting: Family Medicine

## 2022-05-22 VITALS — BP 112/78 | HR 81 | Ht 66.0 in | Wt 183.0 lb

## 2022-05-22 DIAGNOSIS — M79675 Pain in left toe(s): Secondary | ICD-10-CM | POA: Diagnosis not present

## 2022-05-22 DIAGNOSIS — G8929 Other chronic pain: Secondary | ICD-10-CM | POA: Diagnosis not present

## 2022-05-22 DIAGNOSIS — M222X2 Patellofemoral disorders, left knee: Secondary | ICD-10-CM | POA: Diagnosis not present

## 2022-05-22 NOTE — Assessment & Plan Note (Signed)
Do believe the patient does have some underlying arthritic changes that likely is contributing.  Discussed with patient about icing regimen and home exercises, which activity which will still avoid.  Increase activity slowly.  Follow-up again in 6 to 8 weeks

## 2022-05-22 NOTE — Patient Instructions (Addendum)
Don't forget to wear brace Hoka, Bondhi, or Arahi shoes Oofos recovery sandals in house Spenco orthotics slim for work shoes Hopefully we'll get approval for gel Read on PRP

## 2022-05-22 NOTE — Assessment & Plan Note (Signed)
Toe pain does have tenderness tightness noted, discussed over-the-counter insoles as well.  I believe the patient did have an injury to the flexor tendon previously.  Follow-up again in 4 to 8 weeks

## 2022-06-08 DIAGNOSIS — F411 Generalized anxiety disorder: Secondary | ICD-10-CM | POA: Diagnosis not present

## 2022-06-14 DIAGNOSIS — H10022 Other mucopurulent conjunctivitis, left eye: Secondary | ICD-10-CM | POA: Diagnosis not present

## 2022-06-19 DIAGNOSIS — F411 Generalized anxiety disorder: Secondary | ICD-10-CM | POA: Diagnosis not present

## 2022-06-28 NOTE — Progress Notes (Signed)
  [facility] [address] [place] [address] Phone: [phone] Subjective:    I'm seeing this patient by the request  of:  Patient, No Pcp Per  CC: Left knee and left large toe pain follow-up  [id number]  05/22/2022 Toe pain does have tenderness tightness noted, discussed over-the-counter insoles as well.  I believe the patient did have an injury to the flexor tendon previously.  Follow-up again in 4 to 8 weeks     Do believe the patient does have some underlying arthritic changes that likely is contributing.  Discussed with patient about icing regimen and home exercises, which activity which will still avoid.  Increase activity slowly.  Follow-up again in 6 to 8 weeks      Update 06/29/2022 FIORELA PELZER is a 41 y.o. female coming in with complaint of L knee and L great toe pain. Patient states after long discussion she would like to try the PRP.  No charge visit   Previous x-rays of the left knee were independently visualized by me showing mild arthritic changes tricompartmental spurring noted.  Previous ultrasound showed that there was more patellofemoral arthritis than anticipated as well.      Impression and Recommendations:    The above documentation has been reviewed and is accurate and complete Judi Saa, DO

## 2022-06-29 ENCOUNTER — Ambulatory Visit: Payer: BC Managed Care – PPO | Admitting: Family Medicine

## 2022-06-29 ENCOUNTER — Ambulatory Visit (INDEPENDENT_AMBULATORY_CARE_PROVIDER_SITE_OTHER): Payer: Self-pay | Admitting: Family Medicine

## 2022-06-29 VITALS — Ht 66.0 in

## 2022-06-29 VITALS — BP 122/84 | HR 81 | Ht 66.0 in | Wt 184.0 lb

## 2022-06-29 DIAGNOSIS — M222X2 Patellofemoral disorders, left knee: Secondary | ICD-10-CM

## 2022-06-29 NOTE — Patient Instructions (Signed)
No ice or IBU for 3 days Heat and Tylenol are ok See me again in 6-8 weeks 

## 2022-06-30 NOTE — Progress Notes (Signed)
[facility] [address] [place] [address] Phone: [phone] Subjective:    I'm seeing this patient by the request  of:  Patient, No Pcp Per  CC: Left knee pain  [id number]  Mercedes Diaz is a 41 y.o. female coming in with complaint of left knee pain.  After discussion at last visit patient has elected to do this secondary to also insurance coverage of the viscosupplementation.     Past Medical History:  Diagnosis Date   Asthma    with cold symptoms    Kidney stone    PMS (premenstrual syndrome)    Past Surgical History:  Procedure Laterality Date   DILATION AND EVACUATION N/A 03/14/2013   Procedure: DILATATION AND EVACUATION;  Surgeon: Lavina Hamman, MD;  Location: [facility];  Service: Gynecology;  Laterality: N/A;   KNEE ARTHROSCOPY     PILONIDAL CYST DRAINAGE     Social History   Socioeconomic History   Marital status: Married    Spouse name: Not on file   Number of children: Not on file   Years of education: Not on file   Highest education level: Not on file  Occupational History   Not on file  Tobacco Use   Smoking status: Never   Smokeless tobacco: Never  Substance and Sexual Activity   Alcohol use: No   Drug use: No   Sexual activity: Yes    Birth control/protection: None  Other Topics Concern   Not on file  Social History Narrative   Not on file   Social Determinants of Health   Financial Resource Strain: Not on file  Food Insecurity: Not on file  Transportation Needs: Not on file  Physical Activity: Not on file  Stress: Not on file  Social Connections: Not on file   Allergies  Allergen Reactions   Ibuprofen Other (See Comments)    Rectal bleeding   No family history on file.       Current Outpatient Medications (Other):    buPROPion (WELLBUTRIN XL) 150 MG 24 hr tablet, Take 150 mg by mouth daily.   Prenatal Vit-Fe Fumarate-FA (PRENATAL MULTIVITAMIN) TABS, Take 1 tablet by mouth daily at 12  noon.   Reviewed prior external information including notes and imaging from  primary care provider As well as notes that were available from care everywhere and other healthcare systems.  Past medical history, social, surgical and family history all reviewed in electronic medical record.  No pertanent information unless stated regarding to the chief complaint.   Review of Systems:  No headache, visual changes, nausea, vomiting, diarrhea, constipation, dizziness, abdominal pain, skin rash, fevers, chills, night sweats, weight loss, swollen lymph nodes, body aches, joint swelling, chest pain, shortness of breath, mood changes. POSITIVE muscle aches  Objective  Height 5\' 6"  (1.676 m), unknown if currently breastfeeding.   General: No apparent distress alert and oriented x3 mood and affect normal, dressed appropriately.  HEENT: Pupils equal, extraocular movements intact  Respiratory: Patient's speak in full sentences and does not appear short of breath  Cardiovascular: No lower extremity edema, non tender, no erythema   After informed written and verbal consent, patient was seated on exam table. Left knee was prepped with alcohol swab and utilizing anterolateral approach, patient's left knee space was injected with 1 cc of 0.5% Marcaine and then injected 5 cc of PRP. Patient tolerated the procedure well without immediate complications.   Impression and Recommendations:    The above documentation has  been reviewed and is accurate and complete Lyndal Pulley, DO

## 2022-06-30 NOTE — Assessment & Plan Note (Addendum)
Patient given injection and tolerated the procedure well.  With patient having the postsurgical changes we will see how patient does with this.  Optimistic that patient will make some improvement just 1 can see how much it will help though.  Discussed avoiding ice for the next 72 hours.  Post PRP instructions given.  Follow-up again in 6 to 8 weeks.  Patient's goal is to be running a half marathon in March

## 2022-06-30 NOTE — Assessment & Plan Note (Signed)
Patient was seen in the office and we decided to do PRP.  This was a no charge visit today.

## 2022-07-03 DIAGNOSIS — F411 Generalized anxiety disorder: Secondary | ICD-10-CM | POA: Diagnosis not present

## 2022-07-18 DIAGNOSIS — F411 Generalized anxiety disorder: Secondary | ICD-10-CM | POA: Diagnosis not present

## 2022-08-09 NOTE — Progress Notes (Signed)
[facility] [address] [place] [address] Phone: [phone] Subjective:   Bruce Donath, am serving as a scribe for Dr. Antoine Primas.  I'm seeing this patient by the request  of:  Patient, No Pcp Per  CC: Knee pain follow-up  [id number]  06/29/2022 Patient given injection and tolerated the procedure well.  With patient having the postsurgical changes we will see how patient does with this.  Optimistic that patient will make some improvement just 1 can see how much it will help though.  Discussed avoiding ice for the next 72 hours.  Post PRP instructions given.  Follow-up again in 6 to 8 weeks.  Patient's goal is to be running a half marathon in March      Update 08/11/2022 Mercedes Diaz is a 41 y.o. female coming in with complaint of L knee pain. Patient states that she is having new or different symptoms. Unsure if she is any better. Has been walking in the pool and feels like there is a bubble in her knee. Continues to have pain and crepitus. Tried running for a short period ie. 30 seconds but has not tried any long runs.        Past Medical History:  Diagnosis Date   Asthma    with cold symptoms    Kidney stone    PMS (premenstrual syndrome)    Past Surgical History:  Procedure Laterality Date   DILATION AND EVACUATION N/A 03/14/2013   Procedure: DILATATION AND EVACUATION;  Surgeon: Lavina Hamman, MD;  Location: [facility];  Service: Gynecology;  Laterality: N/A;   KNEE ARTHROSCOPY     PILONIDAL CYST DRAINAGE     Social History   Socioeconomic History   Marital status: Married    Spouse name: Not on file   Number of children: Not on file   Years of education: Not on file   Highest education level: Not on file  Occupational History   Not on file  Tobacco Use   Smoking status: Never   Smokeless tobacco: Never  Substance and Sexual Activity   Alcohol use: No   Drug use: No   Sexual activity: Yes    Birth  control/protection: None  Other Topics Concern   Not on file  Social History Narrative   Not on file   Social Determinants of Health   Financial Resource Strain: Not on file  Food Insecurity: Not on file  Transportation Needs: Not on file  Physical Activity: Not on file  Stress: Not on file  Social Connections: Not on file   Allergies  Allergen Reactions   Ibuprofen Other (See Comments)    Rectal bleeding   No family history on file.       Current Outpatient Medications (Other):    buPROPion (WELLBUTRIN XL) 150 MG 24 hr tablet, Take 150 mg by mouth daily.   Prenatal Vit-Fe Fumarate-FA (PRENATAL MULTIVITAMIN) TABS, Take 1 tablet by mouth daily at 12 noon.   Reviewed prior external information including notes and imaging from  primary care provider As well as notes that were available from care everywhere and other healthcare systems.  Past medical history, social, surgical and family history all reviewed in electronic medical record.  No pertanent information unless stated regarding to the chief complaint.   Review of Systems:  No headache, visual changes, nausea, vomiting, diarrhea, constipation, dizziness, abdominal pain, skin rash, fevers, chills, night sweats, weight loss, swollen lymph nodes, body aches, joint swelling, chest  pain, shortness of breath, mood changes. POSITIVE muscle aches  Objective  Blood pressure 108/72, pulse 86, height 5\' 6"  (1.676 m), weight 186 lb (84.4 kg), SpO2 98 %, unknown if currently breastfeeding.   General: No apparent distress alert and oriented x3 mood and affect normal, dressed appropriately.  HEENT: Pupils equal, extraocular movements intact  Respiratory: Patient's speak in full sentences and does not appear short of breath  Cardiovascular: No lower extremity edema, non tender, no erythema  Left knee still has some lateral tracking noted.  Patient does have some crepitus noted.  Tender to palpation in the paraspinal  musculature.   Limited muscular skeletal ultrasound was performed and interpreted by , M  Limited ultrasound shows the patient does have some narrowing of the patellofemoral joint.  Postsurgical changes noted as well. Impression: No significant hypoechoic changes but still the patellofemoral arthritis   Impression and Recommendations:     The above documentation has been reviewed and is accurate and complete Antoine Primas, DO

## 2022-08-15 ENCOUNTER — Encounter: Payer: Self-pay | Admitting: Family Medicine

## 2022-08-15 ENCOUNTER — Ambulatory Visit (INDEPENDENT_AMBULATORY_CARE_PROVIDER_SITE_OTHER): Payer: BC Managed Care – PPO | Admitting: Family Medicine

## 2022-08-15 ENCOUNTER — Ambulatory Visit: Payer: Self-pay

## 2022-08-15 VITALS — BP 108/72 | HR 86 | Ht 66.0 in | Wt 186.0 lb

## 2022-08-15 DIAGNOSIS — G8929 Other chronic pain: Secondary | ICD-10-CM | POA: Diagnosis not present

## 2022-08-15 DIAGNOSIS — M25562 Pain in left knee: Secondary | ICD-10-CM

## 2022-08-15 DIAGNOSIS — M222X2 Patellofemoral disorders, left knee: Secondary | ICD-10-CM

## 2022-08-15 NOTE — Assessment & Plan Note (Signed)
Patient has significant symptoms previous exam.  Tightness noted from the knee and still weakness of the VMO.  Patient will continue to wear the brace when working out worsening pain we can try another injection.  Patient will follow-up with me again in 6 to 8 weeks

## 2022-08-15 NOTE — Patient Instructions (Addendum)
Good to see you Ok to start with 2 min jog 20min walk Increase 1 min each  week Wear brace Ice after activity See me in 6-8 weeks

## 2022-08-29 DIAGNOSIS — F411 Generalized anxiety disorder: Secondary | ICD-10-CM | POA: Diagnosis not present

## 2022-09-25 NOTE — Progress Notes (Unsigned)
[facility] [place] Phone: [phone] Subjective:   Fontaine No, am serving as a scribe for Dr. Hulan Saas.  I'm seeing this patient by the request  of:  Patient, No Pcp Per  CC: Left knee pain  [id number]  08/15/2022 Patient has significant symptoms previous exam.  Tightness noted from the knee and still weakness of the VMO.  Patient will continue to wear the brace when working out worsening pain we can try another injection.  Patient will follow-up with me again in 6 to 8 weeks      Update 09/27/2022 LATRINITY ONSTOTT is a 42 y.o. female coming in with complaint of L knee pain. Patient states that she is doing ok. Continues to have crunching. In the morning she feels like there is a knot in the joint towards the lateral aspect. Running is ok but doing squats aggravate her knee.       Past Medical History:  Diagnosis Date   Asthma    with cold symptoms    Kidney stone    PMS (premenstrual syndrome)    Past Surgical History:  Procedure Laterality Date   DILATION AND EVACUATION N/A 03/14/2013   Procedure: DILATATION AND EVACUATION;  Surgeon: Cheri Fowler, MD;  Location: [facility];  Service: Gynecology;  Laterality: N/A;   KNEE ARTHROSCOPY     PILONIDAL CYST DRAINAGE     Social History   Socioeconomic History   Marital status: Married    Spouse name: Not on file   Number of children: Not on file   Years of education: Not on file   Highest education level: Not on file  Occupational History   Not on file  Tobacco Use   Smoking status: Never   Smokeless tobacco: Never  Substance and Sexual Activity   Alcohol use: No   Drug use: No   Sexual activity: Yes    Birth control/protection: None  Other Topics Concern   Not on file  Social History Narrative   Not on file   Social Determinants of Health   Financial Resource Strain: Not on file  Food Insecurity: Not on file  Transportation Needs: Not on  file  Physical Activity: Not on file  Stress: Not on file  Social Connections: Not on file   Allergies  Allergen Reactions   Ibuprofen Other (See Comments)    Rectal bleeding   No family history on file.       Current Outpatient Medications (Other):    buPROPion (WELLBUTRIN XL) 150 MG 24 hr tablet, Take 150 mg by mouth daily.   Prenatal Vit-Fe Fumarate-FA (PRENATAL MULTIVITAMIN) TABS, Take 1 tablet by mouth daily at 12 noon.   Reviewed prior external information including notes and imaging from  primary care provider As well as notes that were available from care everywhere and other healthcare systems.  Past medical history, social, surgical and family history all reviewed in electronic medical record.  No pertanent information unless stated regarding to the chief complaint.   Review of Systems:  No headache, visual changes, nausea, vomiting, diarrhea, constipation, dizziness, abdominal pain, skin rash, fevers, chills, night sweats, weight loss, swollen lymph nodes, body aches, joint swelling, chest pain, shortness of breath, mood changes. POSITIVE muscle aches  Objective  Blood pressure 122/84, pulse 75, height 5' 6"$  (1.676 m), SpO2 99 %, unknown if currently breastfeeding.   General: No apparent distress alert and oriented x3 mood and affect normal, dressed appropriately.  HEENT: Pupils equal, extraocular movements intact  Respiratory: Patient's speak in full sentences and does not appear short of breath  Cardiovascular: No lower extremity edema, non tender, no erythema  Left knee exam does show the patient has had some improvement in the of the patellofemoral area and some mild improvement in the VMO strengthening.  Still has some difficulty noted though with some crepitus noted on exam today.     Impression and Recommendations:    The above documentation has been reviewed and is accurate and complete Lyndal Pulley, DO

## 2022-09-26 DIAGNOSIS — F411 Generalized anxiety disorder: Secondary | ICD-10-CM | POA: Diagnosis not present

## 2022-09-27 ENCOUNTER — Ambulatory Visit (INDEPENDENT_AMBULATORY_CARE_PROVIDER_SITE_OTHER): Payer: BC Managed Care – PPO | Admitting: Family Medicine

## 2022-09-27 VITALS — BP 122/84 | HR 75 | Ht 66.0 in

## 2022-09-27 DIAGNOSIS — Z6829 Body mass index (BMI) 29.0-29.9, adult: Secondary | ICD-10-CM | POA: Diagnosis not present

## 2022-09-27 DIAGNOSIS — Z1389 Encounter for screening for other disorder: Secondary | ICD-10-CM | POA: Diagnosis not present

## 2022-09-27 DIAGNOSIS — Z01419 Encounter for gynecological examination (general) (routine) without abnormal findings: Secondary | ICD-10-CM | POA: Diagnosis not present

## 2022-09-27 DIAGNOSIS — M222X2 Patellofemoral disorders, left knee: Secondary | ICD-10-CM | POA: Diagnosis not present

## 2022-09-27 DIAGNOSIS — Z130 Encounter for screening for diseases of the blood and blood-forming organs and certain disorders involving the immune mechanism: Secondary | ICD-10-CM | POA: Diagnosis not present

## 2022-09-27 DIAGNOSIS — Z1231 Encounter for screening mammogram for malignant neoplasm of breast: Secondary | ICD-10-CM | POA: Diagnosis not present

## 2022-09-27 NOTE — Assessment & Plan Note (Addendum)
Patient continues to make some improvement.  Patient does have a running coming in the near future and can consider possibly a steroid injection before we would do anything else and allow her to run still.  Discussed icing regimen and home exercises, which activities to do and which ones to avoid, activity slowly.  Follow-up again in 6 to 8 weeks patient is a plan to be running a marathon in April

## 2022-10-04 ENCOUNTER — Other Ambulatory Visit: Payer: Self-pay | Admitting: Obstetrics and Gynecology

## 2022-10-04 DIAGNOSIS — R928 Other abnormal and inconclusive findings on diagnostic imaging of breast: Secondary | ICD-10-CM

## 2022-10-17 ENCOUNTER — Ambulatory Visit
Admission: RE | Admit: 2022-10-17 | Discharge: 2022-10-17 | Disposition: A | Discharge status: Home / Self Care | Payer: BC Managed Care – PPO | Source: Ambulatory Visit | Attending: Obstetrics and Gynecology | Admitting: Obstetrics and Gynecology

## 2022-10-17 ENCOUNTER — Ambulatory Visit: Payer: BC Managed Care – PPO

## 2022-10-17 DIAGNOSIS — R922 Inconclusive mammogram: Secondary | ICD-10-CM | POA: Diagnosis not present

## 2022-10-17 DIAGNOSIS — R928 Other abnormal and inconclusive findings on diagnostic imaging of breast: Secondary | ICD-10-CM

## 2022-10-24 DIAGNOSIS — F411 Generalized anxiety disorder: Secondary | ICD-10-CM | POA: Diagnosis not present

## 2022-11-06 NOTE — Progress Notes (Signed)
[facility] Mercedes Diaz [address] [place] Phone: [phone] Subjective:   Mercedes Diaz, am serving as a scribe for Dr. Hulan Saas.  I'm seeing this patient by the request  of:  Michael Boston, MD  CC: Left knee pain follow-up  [id number]  09/27/2022 Patient continues to make some improvement.  Patient does have a running coming in the near future and can consider possibly a steroid injection before we would do anything else and allow her to run still.  Discussed icing regimen and home exercises, which activities to do and which ones to avoid, activity slowly.  Follow-up again in 6 to 8 weeks patient is a plan to be running a marathon in April    Update 11/15/2022 Mercedes Diaz is a 42 y.o. female coming in with complaint of L knee pain. Patient states doing well. During activity will sporadically have sharp pain on lateral side at the midline. Pain is short lived but happened while running, doing a reverse lunge, and walking. No new concerns.        Past Medical History:  Diagnosis Date   Asthma    with cold symptoms    Kidney stone    PMS (premenstrual syndrome)    Past Surgical History:  Procedure Laterality Date   DILATION AND EVACUATION N/A 03/14/2013   Procedure: DILATATION AND EVACUATION;  Surgeon: Cheri Fowler, MD;  Location: [facility];  Service: Gynecology;  Laterality: N/A;   KNEE ARTHROSCOPY     PILONIDAL CYST DRAINAGE     Social History   Socioeconomic History   Marital status: Married    Spouse name: Not on file   Number of children: Not on file   Years of education: Not on file   Highest education level: Not on file  Occupational History   Not on file  Tobacco Use   Smoking status: Never   Smokeless tobacco: Never  Substance and Sexual Activity   Alcohol use: No   Drug use: No   Sexual activity: Yes    Birth control/protection: None  Other Topics Concern   Not on file  Social History Narrative   Not on  file   Social Determinants of Health   Financial Resource Strain: Not on file  Food Insecurity: Not on file  Transportation Needs: Not on file  Physical Activity: Not on file  Stress: Not on file  Social Connections: Not on file   Allergies  Allergen Reactions   Ibuprofen Other (See Comments)    Rectal bleeding   No family history on file.  Current Outpatient Medications (Endocrine & Metabolic):    predniSONE (DELTASONE) 20 MG tablet, Take 1 tablet (20 mg total) by mouth daily with breakfast.      Current Outpatient Medications (Other):    buPROPion (WELLBUTRIN XL) 150 MG 24 hr tablet, Take 150 mg by mouth daily.   Prenatal Vit-Fe Fumarate-FA (PRENATAL MULTIVITAMIN) TABS, Take 1 tablet by mouth daily at 12 noon.    Review of Systems:  No headache, visual changes, nausea, vomiting, diarrhea, constipation, dizziness, abdominal pain, skin rash, fevers, chills, night sweats, weight loss, swollen lymph nodes, body aches, joint swelling, chest pain, shortness of breath, mood changes. POSITIVE muscle aches  Objective  Blood pressure 110/74, pulse 80, height 5\' 6"  (1.676 m), weight 181 lb (82.1 kg), SpO2 98 %, unknown if currently breastfeeding.   General: No apparent distress alert and oriented x3 mood and affect normal, dressed appropriately.  HEENT:  Pupils equal, extraocular movements intact  Respiratory: Patient's speak in full sentences and does not appear short of breath  Cardiovascular: No lower extremity edema, non tender, no erythema  Left knee does have some mild lateral tilt noted.  He does still have some crepitus noted.  Negative grind test is noted with pain.  Patient does have good range of motion otherwise.  Good stability of the knee otherwise.    Impression and Recommendations:     The above documentation has been reviewed and is accurate and complete Lyndal Pulley, DO

## 2022-11-15 ENCOUNTER — Ambulatory Visit: Payer: BC Managed Care – PPO | Admitting: Family Medicine

## 2022-11-15 VITALS — BP 110/74 | HR 80 | Ht 66.0 in | Wt 181.0 lb

## 2022-11-15 DIAGNOSIS — M222X2 Patellofemoral disorders, left knee: Secondary | ICD-10-CM | POA: Diagnosis not present

## 2022-11-15 MED ORDER — PREDNISONE 20 MG PO TABS: 20.0000 mg | ORAL_TABLET | Freq: Every day | ORAL | 0 refills | Status: AC

## 2022-11-15 NOTE — Patient Instructions (Addendum)
Prednisone 20mg  for 5 days

## 2022-11-15 NOTE — Assessment & Plan Note (Signed)
I believe the patient is doing significantly better at this time.  Did have the PRP and is able to actually run.  Discussed with patient icing regimen and home exercises.  Given prednisone just in case.  Follow-up with me again as needed

## 2022-11-23 DIAGNOSIS — R350 Frequency of micturition: Secondary | ICD-10-CM | POA: Diagnosis not present

## 2022-11-23 DIAGNOSIS — F411 Generalized anxiety disorder: Secondary | ICD-10-CM | POA: Diagnosis not present

## 2022-11-23 DIAGNOSIS — H8113 Benign paroxysmal vertigo, bilateral: Secondary | ICD-10-CM | POA: Diagnosis not present

## 2022-12-05 ENCOUNTER — Encounter: Payer: Self-pay | Admitting: Family Medicine

## 2023-04-05 DIAGNOSIS — H5213 Myopia, bilateral: Secondary | ICD-10-CM | POA: Diagnosis not present

## 2023-04-05 DIAGNOSIS — F411 Generalized anxiety disorder: Secondary | ICD-10-CM | POA: Diagnosis not present

## 2023-04-20 ENCOUNTER — Ambulatory Visit: Payer: BC Managed Care – PPO | Admitting: Sports Medicine

## 2023-04-20 VITALS — BP 110/72 | HR 73 | Ht 66.0 in | Wt 188.0 lb

## 2023-04-20 DIAGNOSIS — M79605 Pain in left leg: Secondary | ICD-10-CM | POA: Diagnosis not present

## 2023-04-20 DIAGNOSIS — M25562 Pain in left knee: Secondary | ICD-10-CM

## 2023-04-20 DIAGNOSIS — G8929 Other chronic pain: Secondary | ICD-10-CM | POA: Diagnosis not present

## 2023-04-20 DIAGNOSIS — M222X2 Patellofemoral disorders, left knee: Secondary | ICD-10-CM

## 2023-04-20 MED ORDER — PREDNISONE 20 MG PO TABS: ORAL_TABLET | ORAL | 0 refills | Status: AC

## 2023-04-20 NOTE — Patient Instructions (Signed)
Prednisone 20 mg daily for 5 days Recommend voltaren gel over areas of pain 1-2 times a day  Ankle HEP  As needed follow up

## 2023-04-20 NOTE — Progress Notes (Signed)
Mercedes Diaz D[facility] [address] [place] [address] Phone: [phone]   Assessment and Plan:     1. Patellofemoral syndrome of left knee 2. Chronic pain of left knee 3. Left leg pain -Chronic with exacerbation, subsequent sports medicine visit - Recurrent left knee pain consistent with flare of patellofemoral syndrome, however new pain across anterior left shin and anterior left ankle.  Most likely strain of anterior tibialis musculature due to physical activity and lunges.  Unlikely medial tibial stress syndrome based on patient's relative decrease in physical activity recently - Patient cannot use prescription strength p.o. NSAIDs due to history of rectal bleeding.  Can try topical Voltaren gel which should have minimal systemic absorption.  Discontinue Voltaren gel if rectal bleeding returns - Patient has had relief in the past with prednisone 20 mg daily for 5 days.  Refill provided for an additional course - Start HEP for ankle, anterior tibialis  15 additional minutes spent for educating Therapeutic Home Exercise Program.  This included exercises focusing on stretching, strengthening, with focus on eccentric aspects.   Long term goals include an improvement in range of motion, strength, endurance as well as avoiding reinjury. Patient's frequency would include in 1-2 times a day, 3-5 times a week for a duration of 6-12 weeks. Proper technique shown and discussed handout in great detail with ATC.  All questions were discussed and answered.    Other orders - predniSONE (DELTASONE) 20 MG tablet; Take one tablet daily for the next 5 days.    Pertinent previous records reviewed include none   Follow Up: 1 week if no improvement or worsening of symptoms.  Could consider x-ray of tib-fib and ankle versus ultrasound of these areas   Subjective:   I, Mercedes Diaz, am serving as a Neurosurgeon for Doctor Richardean Sale  Chief Complaint: left  knee pain   HPI:   09/27/2022 Patient continues to make some improvement.  Patient does have a running coming in the near future and can consider possibly a steroid injection before we would do anything else and allow her to run still.  Discussed icing regimen and home exercises, which activities to do and which ones to avoid, activity slowly.  Follow-up again in 6 to 8 weeks patient is a plan to be running a marathon in April     Update 11/15/2022 Mercedes Diaz is a 42 y.o. female coming in with complaint of L knee pain. Patient states doing well. During activity will sporadically have sharp pain on lateral side at the midline. Pain is short lived but happened while running, doing a reverse lunge, and walking. No new concerns.   04/20/23 Patient states knee pain has radiated to her ankle she did a workout on Monday. Endorses swelling and burning top of her foot. Decreased ROM ankle/foot . No MOI   Relevant Historical Information: None pertinent  Additional pertinent review of systems negative.   Current Outpatient Medications:    predniSONE (DELTASONE) 20 MG tablet, Take one tablet daily for the next 5 days., Disp: 5 tablet, Rfl: 0   buPROPion (WELLBUTRIN XL) 150 MG 24 hr tablet, Take 150 mg by mouth daily., Disp: , Rfl:    predniSONE (DELTASONE) 20 MG tablet, Take 1 tablet (20 mg total) by mouth daily with breakfast., Disp: 5 tablet, Rfl: 0   Prenatal Vit-Fe Fumarate-FA (PRENATAL MULTIVITAMIN) TABS, Take 1 tablet by mouth daily at 12 noon., Disp: , Rfl:    Objective:  Vitals:   04/20/23 1023  BP: 110/72  Pulse: 73  SpO2: 100%  Weight: 188 lb (85.3 kg)  Height: 5\' 6"  (1.676 m)      Body mass index is 30.34 kg/m.    Physical Exam:    Gen: Appears well, nad, nontoxic and pleasant Psych: Alert and oriented, appropriate mood and affect Neuro: sensation intact, strength is 5/5 with df/pf/inv/ev, muscle tone wnl Skin: no susupicious lesions or rashes  Left shin/ankle:  No  deformity, no swelling or effusion TTP medial tibia along tibial shaft and along musculature, lateral to tibia along musculature, anterior ankle joint line, anterior tibialis tendon crossing ankle NTTP over fibular head, lat mal, medial mal, achilles, navicular, base of 5th, ATFL, CFL, deltoid, calcaneous or midfoot ROM DF 30, PF 45, inv/ev intact Negative ant drawer, talar tilt, rotation test, squeeze test. Neg thompson No pain with resisted inversion or eversion  Single-leg heel raise and toe raise did not reproduce pain  Electronically signed by:  Mercedes Diaz D[facility] 11:23 AM 04/20/23

## 2023-05-11 NOTE — Progress Notes (Unsigned)
[facility] [address] [place] [address] Phone: [phone] Subjective:   Mercedes Diaz, am serving as a scribe for Dr. Antoine Primas.  I'm seeing this patient by the request  of:  Melida Quitter, MD  CC: Left knee pain  [id number]  11/15/2022 I believe the patient is doing significantly better at this time. Did have the PRP and is able to actually run. Discussed with patient icing regimen and home exercises. Given prednisone just in case. Follow-up with me again as needed   Updated 05/16/2023 Mercedes Diaz is a 42 y.o. female coming in with complaint of L knee. Feels like she can't squat. About 2 days after squatting or lunging pain lowe leg has burning pain. Feel like a clunk. Only strength training not cardio that causes the pain.  Patient feels like it is continuing to catch her.  Unable to increase activity secondary to the discomfort.       Past Medical History:  Diagnosis Date   Asthma    with cold symptoms    Kidney stone    PMS (premenstrual syndrome)    Past Surgical History:  Procedure Laterality Date   DILATION AND EVACUATION N/A 03/14/2013   Procedure: DILATATION AND EVACUATION;  Surgeon: Lavina Hamman, MD;  Location: [facility];  Service: Gynecology;  Laterality: N/A;   KNEE ARTHROSCOPY     PILONIDAL CYST DRAINAGE     Social History   Socioeconomic History   Marital status: Married    Spouse name: Not on file   Number of children: Not on file   Years of education: Not on file   Highest education level: Not on file  Occupational History   Not on file  Tobacco Use   Smoking status: Never   Smokeless tobacco: Never  Substance and Sexual Activity   Alcohol use: No   Drug use: No   Sexual activity: Yes    Birth control/protection: None  Other Topics Concern   Not on file  Social History Narrative   Not on file   Social Determinants of Health   Financial Resource Strain: Not on file  Food Insecurity: Not on  file  Transportation Needs: Not on file  Physical Activity: Not on file  Stress: Not on file  Social Connections: Unknown (12/27/2021)   Received from [facility]   Social Network    Social Network: Not on file   Allergies  Allergen Reactions   Ibuprofen Other (See Comments)    Rectal bleeding   No family history on file.  Current Outpatient Medications (Endocrine & Metabolic):    predniSONE (DELTASONE) 20 MG tablet, Take 1 tablet (20 mg total) by mouth daily with breakfast.   predniSONE (DELTASONE) 20 MG tablet, Take one tablet daily for the next 5 days.      Current Outpatient Medications (Other):    buPROPion (WELLBUTRIN XL) 150 MG 24 hr tablet, Take 150 mg by mouth daily.   Prenatal Vit-Fe Fumarate-FA (PRENATAL MULTIVITAMIN) TABS, Take 1 tablet by mouth daily at 12 noon.   Reviewed prior external information including notes and imaging from  primary care provider As well as notes that were available from care everywhere and other healthcare systems.  Past medical history, social, surgical and family history all reviewed in electronic medical record.  No pertanent information unless stated regarding to the chief complaint.   Review of Systems:  No headache, visual changes, nausea, vomiting, diarrhea, constipation, dizziness, abdominal pain,  skin rash, fevers, chills, night sweats, weight loss, swollen lymph nodes, body aches, joint swelling, chest pain, shortness of breath, mood changes. POSITIVE muscle aches  Objective  Blood pressure 114/82, height 5\' 6"  (1.676 m), weight 185 lb (83.9 kg), unknown if currently breastfeeding.   General: No apparent distress alert and oriented x3 mood and affect normal, dressed appropriately.  HEENT: Pupils equal, extraocular movements intact  Respiratory: Patient's speak in full sentences and does not appear short of breath  Cardiovascular: No lower extremity edema, non tender, no erythema  Left knee does have a trace  effusion noted, postsurgical changes noted.  Severe tenderness to palpation over the medial joint line.  Antalgic gait noted instability also noted of the knee with valgus and varus force  Limited muscular skeletal ultrasound was performed and interpreted by Antoine Primas, M  Hypoechoic changes in the patellofemoral joint that is more than physiologic.  On does have some thickening of the synovium as well consistent with a mild synovitis.  Patient's medial meniscus shows postsurgical changes noted. Impression: Pathological swelling of the knee    Impression and Recommendations:

## 2023-05-16 ENCOUNTER — Other Ambulatory Visit: Payer: Self-pay

## 2023-05-16 ENCOUNTER — Ambulatory Visit: Payer: BC Managed Care – PPO | Admitting: Family Medicine

## 2023-05-16 VITALS — BP 114/82 | Ht 66.0 in | Wt 185.0 lb

## 2023-05-16 DIAGNOSIS — G8929 Other chronic pain: Secondary | ICD-10-CM | POA: Diagnosis not present

## 2023-05-16 DIAGNOSIS — M25562 Pain in left knee: Secondary | ICD-10-CM | POA: Diagnosis not present

## 2023-05-16 DIAGNOSIS — M222X2 Patellofemoral disorders, left knee: Secondary | ICD-10-CM

## 2023-05-16 NOTE — Assessment & Plan Note (Signed)
Patient continues to have difficulty.  Attempted the PRP previously but unfortunately is not making any significant increase in range of motion and unfortunately anything over daily activities becomes more trying with the swelling of the knee.  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  Pain is waking her up at night and has failed already formal physical therapy at the moment.  Discussed with patient about what activities to do.  Feel at this point we should consider advanced imaging and get the MRI.  Depending on findings we can discuss further treatment options.  Patient is in agreement with the plan will follow-up again after imaging.

## 2023-05-16 NOTE — Patient Instructions (Signed)
Imaging [phone] Call Today  When we receive your results we will contact you.  

## 2023-05-17 ENCOUNTER — Encounter: Payer: Self-pay | Admitting: Family Medicine

## 2023-05-17 DIAGNOSIS — Z1389 Encounter for screening for other disorder: Secondary | ICD-10-CM | POA: Diagnosis not present

## 2023-05-17 DIAGNOSIS — Z0189 Encounter for other specified special examinations: Secondary | ICD-10-CM | POA: Diagnosis not present

## 2023-05-17 DIAGNOSIS — Z0000 Encounter for general adult medical examination without abnormal findings: Secondary | ICD-10-CM | POA: Diagnosis not present

## 2023-05-22 DIAGNOSIS — Z23 Encounter for immunization: Secondary | ICD-10-CM | POA: Diagnosis not present

## 2023-05-22 DIAGNOSIS — Z0000 Encounter for general adult medical examination without abnormal findings: Secondary | ICD-10-CM | POA: Diagnosis not present

## 2023-05-22 DIAGNOSIS — Z1331 Encounter for screening for depression: Secondary | ICD-10-CM | POA: Diagnosis not present

## 2023-05-22 DIAGNOSIS — Z1339 Encounter for screening examination for other mental health and behavioral disorders: Secondary | ICD-10-CM | POA: Diagnosis not present

## 2023-05-25 ENCOUNTER — Ambulatory Visit
Admission: RE | Admit: 2023-05-25 | Discharge: 2023-05-25 | Disposition: A | Discharge status: Home / Self Care | Payer: BC Managed Care – PPO | Source: Ambulatory Visit | Attending: Family Medicine | Admitting: Family Medicine

## 2023-05-25 DIAGNOSIS — M25562 Pain in left knee: Secondary | ICD-10-CM | POA: Diagnosis not present

## 2023-05-25 DIAGNOSIS — G8929 Other chronic pain: Secondary | ICD-10-CM

## 2023-05-25 DIAGNOSIS — M7989 Other specified soft tissue disorders: Secondary | ICD-10-CM | POA: Diagnosis not present

## 2023-06-06 ENCOUNTER — Encounter: Payer: Self-pay | Admitting: Family Medicine

## 2023-06-06 ENCOUNTER — Other Ambulatory Visit: Payer: Self-pay

## 2023-06-06 DIAGNOSIS — M222X2 Patellofemoral disorders, left knee: Secondary | ICD-10-CM

## 2023-06-15 DIAGNOSIS — J0110 Acute frontal sinusitis, unspecified: Secondary | ICD-10-CM | POA: Diagnosis not present

## 2023-06-15 DIAGNOSIS — R0981 Nasal congestion: Secondary | ICD-10-CM | POA: Diagnosis not present

## 2023-06-19 ENCOUNTER — Other Ambulatory Visit: Payer: Self-pay

## 2023-06-19 DIAGNOSIS — M25562 Pain in left knee: Secondary | ICD-10-CM | POA: Diagnosis not present

## 2023-06-19 DIAGNOSIS — M222X2 Patellofemoral disorders, left knee: Secondary | ICD-10-CM

## 2023-06-19 DIAGNOSIS — M1712 Unilateral primary osteoarthritis, left knee: Secondary | ICD-10-CM | POA: Diagnosis not present

## 2023-06-27 ENCOUNTER — Encounter: Payer: Self-pay | Admitting: Orthopaedic Surgery

## 2023-06-27 ENCOUNTER — Ambulatory Visit: Payer: BC Managed Care – PPO | Admitting: Orthopaedic Surgery

## 2023-06-27 ENCOUNTER — Other Ambulatory Visit (INDEPENDENT_AMBULATORY_CARE_PROVIDER_SITE_OTHER): Payer: Self-pay

## 2023-06-27 DIAGNOSIS — M25562 Pain in left knee: Secondary | ICD-10-CM | POA: Diagnosis not present

## 2023-06-27 DIAGNOSIS — G8929 Other chronic pain: Secondary | ICD-10-CM

## 2023-06-27 MED ORDER — LIDOCAINE HCL 1 % IJ SOLN
2.0000 mL | INTRAMUSCULAR | Status: AC | PRN
  Administered 2023-06-27: 2 mL

## 2023-06-27 MED ORDER — BUPIVACAINE HCL 0.5 % IJ SOLN
2.0000 mL | INTRAMUSCULAR | Status: AC | PRN
  Administered 2023-06-27: 2 mL via INTRA_ARTICULAR

## 2023-06-27 MED ORDER — METHYLPREDNISOLONE ACETATE 40 MG/ML IJ SUSP
40.0000 mg | INTRAMUSCULAR | Status: AC | PRN
  Administered 2023-06-27: 40 mg via INTRA_ARTICULAR

## 2023-06-27 NOTE — Progress Notes (Signed)
Office Visit Note   Patient: Mercedes Diaz           Date of Birth: 1980-09-09           MRN: [id number] Visit Date: 06/27/2023              Requested by: Judi Saa, DO [address] [place],  [place] [address] PCP: Melida Quitter, MD   Assessment & Plan: Visit Diagnoses:  1. 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  She has high-grade chondromalacia based on MRI from a couple weeks ago.  Treatment options were explained and given her age I have recommended holding off on and arthroplasty for as long as she can.  She has not tried cortisone injection or Visco so far.  Today we did a cortisone injection and we provided a handout on Visco.  She will follow-up with [place] if symptoms do not improve after this cortisone injection.  Follow-Up Instructions: No follow-ups on file.   Orders:  Orders Placed This Encounter  Procedures   XR KNEE 3 VIEW LEFT   No orders of the defined types were placed in this encounter.     Procedures: Large Joint Inj: L knee on 06/27/2023 9:10 AM Details: 22 G needle Medications: 2 mL bupivacaine 0.5 %; 2 mL lidocaine 1 %; 40 mg methylPREDNISolone acetate 40 MG/ML Outcome: tolerated well, no immediate complications Patient was prepped and draped in the usual sterile fashion.       Clinical Data: No additional findings.   Subjective: Chief Complaint  Patient presents with   Left Knee - Pain    HPI Mercedes Diaz is a 42 year old female here for evaluation of chronic left knee pain.  She has had pain for years and its gotten worse over the last few years.  She has been seeing Dr. Katrinka Blazing for this.  She underwent a PRP injection which has not significantly helped.  She has mainly lateral sided knee pain and feels a grinding sensation inside the knee.  Oral steroids have helped temporarily.  She denies any mechanical symptoms.  She would really like to be  able to run without pain.  Currently it does not sound like she has pain during ADLs. Review of Systems  Constitutional: Negative.   HENT: Negative.    Eyes: Negative.   Respiratory: Negative.    Cardiovascular: Negative.   Endocrine: Negative.   Musculoskeletal: Negative.   Neurological: Negative.   Hematological: Negative.   Psychiatric/Behavioral: Negative.    All other systems reviewed and are negative.    Objective: Vital Signs: There were no vitals taken for this visit.  Physical Exam Vitals and nursing note reviewed.  Constitutional:      Appearance: She is well-developed.  HENT:     Head: Atraumatic.     Nose: Nose normal.  Eyes:     Extraocular Movements: Extraocular movements intact.  Cardiovascular:     Pulses: Normal pulses.  Pulmonary:     Effort: Pulmonary effort is normal.  Abdominal:     Palpations: Abdomen is soft.  Musculoskeletal:     Cervical back: Neck supple.  Skin:    General: Skin is warm.     Capillary Refill: Capillary refill takes less than 2 seconds.  Neurological:     Mental Status: She is alert. Mental status is at baseline.  Psychiatric:  Behavior: Behavior normal.        Thought Content: Thought content normal.        Judgment: Judgment normal.     Ortho Exam Exam of the left knee shows no joint effusion.  She has slight lateral joint line tenderness.  Collaterals and cruciates are stable.  Normal range of motion. Specialty Comments:  No specialty comments available.  Imaging: No results found.   PMFS History: Patient Active Problem List   Diagnosis Date Noted   Toe pain, chronic, left 05/22/2022   Patellofemoral syndrome of left knee 04/07/2022   Normal pregnancy in multigravida 04/26/2015   SVD (spontaneous vaginal delivery) 04/26/2015   Missed abortion 03/14/2013   Past Medical History:  Diagnosis Date   Asthma    with cold symptoms    Kidney stone    PMS (premenstrual syndrome)     No family history on  file.  Past Surgical History:  Procedure Laterality Date   DILATION AND EVACUATION N/A 03/14/2013   Procedure: DILATATION AND EVACUATION;  Surgeon: Lavina Hamman, MD;  Location: [facility];  Service: Gynecology;  Laterality: N/A;   KNEE ARTHROSCOPY     PILONIDAL CYST DRAINAGE     Social History   Occupational History   Not on file  Tobacco Use   Smoking status: Never   Smokeless tobacco: Never  Substance and Sexual Activity   Alcohol use: No   Drug use: No   Sexual activity: Yes    Birth control/protection: None

## 2023-07-09 ENCOUNTER — Encounter: Payer: Self-pay | Admitting: Physical Therapy

## 2023-07-09 ENCOUNTER — Other Ambulatory Visit: Payer: Self-pay

## 2023-07-09 ENCOUNTER — Ambulatory Visit: Payer: BC Managed Care – PPO | Admitting: Physical Therapy

## 2023-07-09 DIAGNOSIS — M6281 Muscle weakness (generalized): Secondary | ICD-10-CM | POA: Diagnosis not present

## 2023-07-09 DIAGNOSIS — M25562 Pain in left knee: Secondary | ICD-10-CM

## 2023-07-09 DIAGNOSIS — G8929 Other chronic pain: Secondary | ICD-10-CM | POA: Diagnosis not present

## 2023-07-09 NOTE — Therapy (Signed)
OUTPATIENT PHYSICAL THERAPY EVALUATION   Patient Name: Mercedes Diaz MRN: [id number] DOB:09-Mar-1981, 42 y.o., female Today's Date: 07/09/2023  END OF SESSION:  PT End of Session - 07/09/23 1335     Visit Number 1    Number of Visits 6    Date for PT Re-Evaluation 10/01/23    Authorization Type BCBS $60 copay    PT Start Time 1300    PT Stop Time 1334    PT Time Calculation (min) 34 min    Activity Tolerance Patient tolerated treatment well    Behavior During Therapy WFL for tasks assessed/performed             Past Medical History:  Diagnosis Date   Asthma    with cold symptoms    Kidney stone    PMS (premenstrual syndrome)    Past Surgical History:  Procedure Laterality Date   DILATION AND EVACUATION N/A 03/14/2013   Procedure: DILATATION AND EVACUATION;  Surgeon: Lavina Hamman, MD;  Location: [facility];  Service: Gynecology;  Laterality: N/A;   KNEE ARTHROSCOPY     PILONIDAL CYST DRAINAGE     Patient Active Problem List   Diagnosis Date Noted   Toe pain, chronic, left 05/22/2022   Patellofemoral syndrome of left knee 04/07/2022   Normal pregnancy in multigravida 04/26/2015   SVD (spontaneous vaginal delivery) 04/26/2015   Missed abortion 03/14/2013    PCP: Melida Quitter, MD  REFERRING PROVIDER: Tarry Kos, MD  REFERRING DIAG: [phone] (ICD-10-CM) - Chronic pain of left knee  Rationale for Evaluation and Treatment: Rehabilitation  THERAPY DIAG:  Chronic pain of left knee - Plan: PT plan of care cert/re-cert  Muscle weakness (generalized) - Plan: PT plan of care cert/re-cert  ONSET DATE: initial onset 20 years ago (   SUBJECTIVE:                                                                                                                                                                                           SUBJECTIVE STATEMENT: Mercedes Diaz is a 42 year old female here for evaluation of chronic left knee pain. She reports her pain has been  worse over the past year when she started running.  She is still running, but squats and lunges seem to be causing pain.    PERTINENT HISTORY:  N/A  PAIN:  Are you having pain? Yes: NPRS scale: 0 currently, up to 2/10 Pain location: Lt knee Pain description: weakness, burning sensation into foot last time she tried to do squats Aggravating factors: squats, lunges, running > 5-6 miles Relieving factors: prednisone, elevating, ice, rest  PRECAUTIONS:  None  RED FLAGS: None   WEIGHT BEARING RESTRICTIONS:  No  FALLS:  Has patient fallen in last 6 months? 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)  PLOF:  Independent and Leisure: [place] concert band, running, reading, knitting, yoga, barre classes, UB strengthening only currently  PATIENT GOALS:  Improve pain and function in Lt knee; work on strengthening   OBJECTIVE:   DIAGNOSTIC FINDINGS:  MRI: 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, high-grade chondromalacia  PATIENT SURVEYS:  07/09/23 FOTO 71 (predicted 78)  COGNITIVE STATUS: Within functional limits for tasks assessed   SENSATION: WFL  POSTURE:  rounded shoulders and forward head  GAIT: 07/09/23 Comments: independent without significant deviations   PALPATION: 07/09/23 noticeable quad atrophy on Lt    LOWER EXTREMITY ROM:     ROM Right eval Left eval  Knee flexion  A: 132  Knee extension A: 2 A: 2   (Blank rows = not tested)   LOWER EXTREMITY MMT:    MMT Right eval Left eval  Knee flexion 5/5 3+/5  Knee extension 59.3# 27.15#   (Blank rows = not tested)   TREATMENT:                                                                                                                               DATE:  07/09/23 See HEP - educated on exercises with trial reps performed PRN with min cues for modifications and recommendations     PATIENT EDUCATION:  Education details: HEP Person educated: Patient Education method: Programmer, multimedia, Demonstration, and Handouts Education comprehension: verbalized understanding, returned demonstration, and needs further education  HOME EXERCISE PROGRAM: Access Code: [email or web address] URL: [email or web address] Date: 07/09/2023 Prepared by: Moshe Cipro  Exercises - Single Leg Knee Extension with Weight Machine  - 1 x daily - 7 x weekly - 3 sets - 10 reps - Single Leg Hamstring Curl with Weight Machine  - 1 x daily - 7 x weekly - 3 sets - 10 reps - Seated Straight Leg Raise  - 1 x daily - 7 x weekly - 3 sets - 10 reps - Single-Leg [place] Deadlift With Kettlebell  - 1 x daily - 7 x weekly - 3 sets - 10 reps   ASSESSMENT:  CLINICAL IMPRESSION: Patient is a 42 y.o. female who was seen today for physical therapy evaluation and treatment for chronic Lt knee pain. She demonstrates decreased strength and continued pain affecting functional mobility.  She will benefit from PT to address deficits listed.     OBJECTIVE IMPAIRMENTS: decreased strength and pain.   ACTIVITY LIMITATIONS: carrying, lifting, bending, sitting, standing, squatting, stairs, transfers, and locomotion level  PARTICIPATION LIMITATIONS: meal prep,  cleaning, laundry, driving, shopping, community activity, occupation, and yard work  PERSONAL FACTORS: Past/current experiences are also affecting patient's functional outcome.   REHAB POTENTIAL: Good  CLINICAL DECISION MAKING: Stable/uncomplicated  EVALUATION COMPLEXITY: Low   GOALS: Goals reviewed with patient? Yes  SHORT TERM GOALS: Target date: 08/06/2023   Independent with initial HEP Goal status: INITIAL    LONG TERM GOALS: Target date:  10/01/2023  Independent with final HEP Goal status: INITIAL  2.  FOTO score improved to 78 Goal status: INITIAL  3.  Lt quad strength improved to at least 40# for improved strength and function Goal status: INIITAL  4.  Report pain < 2/10 with weight lifting activities for improved function Goal status: INITIAL    PLAN:  PT FREQUENCY: every other week  PT DURATION: 12 weeks  PLANNED INTERVENTIONS: 97164- PT Re-evaluation, 97110-Therapeutic exercises, 97530- Therapeutic activity, 97112- Neuromuscular re-education, 97535- Self Care, 47829- Manual therapy, [id number]- Gait training, [phone]- Aquatic Therapy, 97014- Electrical stimulation (unattended), 97016- Vasopneumatic device, Patient/Family education, Balance training, Stair training, Taping, Dry Needling, Cryotherapy, and Moist heat.  PLAN FOR NEXT SESSION: review HEP, progress quad strength, give water program   NEXT MD VISIT: PRN   Clarita Crane, PT, DPT 07/09/23 1:43 PM

## 2023-07-25 ENCOUNTER — Ambulatory Visit: Payer: BC Managed Care – PPO | Admitting: Physical Therapy

## 2023-07-25 ENCOUNTER — Encounter: Payer: Self-pay | Admitting: Physical Therapy

## 2023-07-25 DIAGNOSIS — G8929 Other chronic pain: Secondary | ICD-10-CM

## 2023-07-25 DIAGNOSIS — M25562 Pain in left knee: Secondary | ICD-10-CM | POA: Diagnosis not present

## 2023-07-25 DIAGNOSIS — M6281 Muscle weakness (generalized): Secondary | ICD-10-CM | POA: Diagnosis not present

## 2023-07-25 NOTE — Therapy (Signed)
OUTPATIENT PHYSICAL THERAPY TREATMENT   Patient Name: Mercedes Diaz MRN: [id number] DOB:11/18/1980, 42 y.o., female Today's Date: 07/25/2023  END OF SESSION:  PT End of Session - 07/25/23 0759     Visit Number 2    Number of Visits 6    Date for PT Re-Evaluation 10/01/23    Authorization Type BCBS $60 copay    PT Start Time 0800    PT Stop Time 0843    PT Time Calculation (min) 43 min    Activity Tolerance Patient tolerated treatment well    Behavior During Therapy WFL for tasks assessed/performed              Past Medical History:  Diagnosis Date   Asthma    with cold symptoms    Kidney stone    PMS (premenstrual syndrome)    Past Surgical History:  Procedure Laterality Date   DILATION AND EVACUATION N/A 03/14/2013   Procedure: DILATATION AND EVACUATION;  Surgeon: Lavina Hamman, MD;  Location: [facility];  Service: Gynecology;  Laterality: N/A;   KNEE ARTHROSCOPY     PILONIDAL CYST DRAINAGE     Patient Active Problem List   Diagnosis Date Noted   Toe pain, chronic, left 05/22/2022   Patellofemoral syndrome of left knee 04/07/2022   Normal pregnancy in multigravida 04/26/2015   SVD (spontaneous vaginal delivery) 04/26/2015   Missed abortion 03/14/2013    PCP: Melida Quitter, MD  REFERRING PROVIDER: Tarry Kos, MD  REFERRING DIAG: [phone] (ICD-10-CM) - Chronic pain of left knee  Rationale for Evaluation and Treatment: Rehabilitation  THERAPY DIAG:  Chronic pain of left knee  Muscle weakness (generalized)  ONSET DATE: initial onset 20 years ago (worse over past year)   SUBJECTIVE:                                                                                                                                                                                           SUBJECTIVE STATEMENT: Knee is more angry today due to the weather.    PERTINENT HISTORY:  N/A  PAIN:  Are you having pain? Yes: NPRS scale: 0 currently, up to 2/10 Pain  location: Lt knee Pain description: weakness, burning sensation into foot last time she tried to do squats Aggravating factors: squats, lunges, running > 5-6 miles Relieving factors: prednisone, elevating, ice, rest  PRECAUTIONS:  None  RED FLAGS: None   WEIGHT BEARING RESTRICTIONS:  No  FALLS:  Has patient fallen in last 6 months? 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)  PLOF:  Independent and Leisure: [place] concert band, running, reading, knitting, yoga, barre classes, UB strengthening only currently  PATIENT GOALS:  Improve pain and function in Lt knee; work on strengthening   OBJECTIVE:   DIAGNOSTIC FINDINGS:  MRI: 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, high-grade chondromalacia  PATIENT SURVEYS:  07/09/23 FOTO 71 (predicted 78)  COGNITIVE STATUS: Within functional limits for tasks assessed   SENSATION: WFL  POSTURE:  rounded shoulders and forward head  GAIT: 07/09/23 Comments: independent without significant deviations   PALPATION: 07/09/23 noticeable quad atrophy on Lt    LOWER EXTREMITY ROM:     ROM Right eval Left eval  Knee flexion  A: 132  Knee extension A: 2 A: 2   (Blank rows = not tested)   LOWER EXTREMITY MMT:    MMT Right eval Left eval  Knee flexion 5/5 3+/5  Knee extension 59.3# 27.15#   (Blank rows = not tested)   TREATMENT:                                                                                                                              DATE:  07/25/23 TherEx Recumbent bike L4 x 8 min Leg press single limb 50# 3x10; bil LLE on 6" step with Rt heel taps 2x10 Lateral lunge with 8# dumbbells  x 10 bil Alternating forward lunges with 8# dumbbells x 10 bil Demonstrated TRX lower body exercises with trial reps for gym: squats; lunges (fwd, lateral, curtsy)  Manual KT tape to Lt knee on 30% stretch - 2 C curves around patella with lateral to medial anchor strip on 50% stretch  07/09/23 See HEP - educated on exercises with trial reps performed PRN with min cues for modifications and recommendations     PATIENT EDUCATION:  Education details: HEP Person educated: Patient Education method: Programmer, multimedia, Facilities manager, and Handouts Education comprehension: verbalized understanding, returned demonstration, and needs further education  HOME EXERCISE PROGRAM: Access Code: [email or web address] URL: [email or web address] Date: 07/25/2023 Prepared by: Moshe Cipro  Exercises - Single Leg Knee Extension with Weight Machine  - 1 x daily - 7 x weekly - 3 sets - 10 reps - Single Leg Hamstring Curl with Weight Machine  - 1 x daily - 7 x weekly - 3 sets - 10 reps - Seated Straight Leg Raise  - 1 x daily - 7 x weekly - 3 sets - 10 reps - Single-Leg [place] Deadlift With Kettlebell  - 1 x daily - 7 x weekly - 3 sets - 10 reps - Lateral Step Down  - 1 x daily - 7 x weekly - 3 sets - 10 reps - Lateral Lunge  - 1 x daily - 7 x weekly - 3 sets - 10 reps -  Kettlebell Swing  - 1 x daily - 7 x weekly - 3 sets - 10-20 reps - Squat  - 1 x daily - 7 x weekly - 3 sets - 10 reps - Single Leg Squat  - 1 x daily - 7 x weekly - 3 sets - 10 reps - Side Stepping  - 1 x daily - 7 x weekly - 3 sets - 10 reps - Standing Hip Circles at [employer]  - 1 x daily - 7 x weekly - 3 sets - 10 reps - Alternating Forward Lunge  - 1 x daily - 7 x weekly - 3 sets - 10 reps - [place]iss[place] Jumping Jacks  - 1 x daily - 7 x weekly - 3 sets - 10 reps   ASSESSMENT:  CLINICAL IMPRESSION: Pt tolerated session well today with focus of exercise variations to work on at home and at the gym and pool .  Will continue to  benefit from PT to maximize function.    OBJECTIVE IMPAIRMENTS: decreased strength and pain.   ACTIVITY LIMITATIONS: carrying, lifting, bending, sitting, standing, squatting, stairs, transfers, and locomotion level  PARTICIPATION LIMITATIONS: meal prep, cleaning, laundry, driving, shopping, community activity, occupation, and yard work  PERSONAL FACTORS: Past/current experiences are also affecting patient's functional outcome.   REHAB POTENTIAL: Good  CLINICAL DECISION MAKING: Stable/uncomplicated  EVALUATION COMPLEXITY: Low   GOALS: Goals reviewed with patient? Yes  SHORT TERM GOALS: Target date: 08/06/2023   Independent with initial HEP Goal status: INITIAL    LONG TERM GOALS: Target date: 10/01/2023  Independent with final HEP Goal status: INITIAL  2.  FOTO score improved to 78 Goal status: INITIAL  3.  Lt quad strength improved to at least 40# for improved strength and function Goal status: INIITAL  4.  Report pain < 2/10 with weight lifting activities for improved function Goal status: INITIAL    PLAN:  PT FREQUENCY: every other week  PT DURATION: 12 weeks  PLANNED INTERVENTIONS: 97164- PT Re-evaluation, 97110-Therapeutic exercises, 97530- Therapeutic activity, 97112- Neuromuscular re-education, 97535- Self Care, 63875- Manual therapy, [id number]- Gait training, [phone]- Aquatic Therapy, 97014- Electrical stimulation (unattended), 97016- Vasopneumatic device, Patient/Family education, Balance training, Stair training, Taping, Dry Needling, Cryotherapy, and Moist heat.  PLAN FOR NEXT SESSION: review HEP PRN,  progress quad strength   NEXT MD VISIT: PRN   Clarita Crane, PT, DPT 07/25/23 8:59 AM

## 2023-08-13 ENCOUNTER — Ambulatory Visit: Payer: BC Managed Care – PPO | Admitting: Physical Therapy

## 2023-08-13 ENCOUNTER — Encounter: Payer: Self-pay | Admitting: Physical Therapy

## 2023-08-13 DIAGNOSIS — M6281 Muscle weakness (generalized): Secondary | ICD-10-CM | POA: Diagnosis not present

## 2023-08-13 DIAGNOSIS — G8929 Other chronic pain: Secondary | ICD-10-CM | POA: Diagnosis not present

## 2023-08-13 DIAGNOSIS — M25562 Pain in left knee: Secondary | ICD-10-CM

## 2023-08-13 NOTE — Therapy (Addendum)
 OUTPATIENT PHYSICAL THERAPY TREATMENT DISCHARGE SUMMARY   Patient Name: Mercedes Diaz MRN: [id number] DOB:1980-11-20, 42 y.o., female Today's Date: 08/13/2023  END OF SESSION:  PT End of Session - 08/13/23 1254     Visit Number 3    Number of Visits 6    Date for PT Re-Evaluation 10/01/23    Authorization Type [employer] $60 copay    PT Start Time 1300    PT Stop Time 1340    PT Time Calculation (min) 40 min    Activity Tolerance Patient tolerated treatment well    Behavior During Therapy WFL for tasks assessed/performed               Past Medical History:  Diagnosis Date   Asthma    with cold symptoms    Kidney stone    PMS (premenstrual syndrome)    Past Surgical History:  Procedure Laterality Date   DILATION AND EVACUATION N/A 03/14/2013   Procedure: DILATATION AND EVACUATION;  Surgeon: Lavina Hamman, MD;  Location: [facility];  Service: Gynecology;  Laterality: N/A;   KNEE ARTHROSCOPY     PILONIDAL CYST DRAINAGE     Patient Active Problem List   Diagnosis Date Noted   Toe pain, chronic, left 05/22/2022   Patellofemoral syndrome of left knee 04/07/2022   Normal pregnancy in multigravida 04/26/2015   SVD (spontaneous vaginal delivery) 04/26/2015   Missed abortion 03/14/2013    PCP: Melida Quitter, MD  REFERRING PROVIDER: Tarry Kos, MD  REFERRING DIAG: [phone] (ICD-10-CM) - Chronic pain of left knee  Rationale for Evaluation and Treatment: Rehabilitation  THERAPY DIAG:  Chronic pain of left knee  Muscle weakness (generalized)  ONSET DATE: initial onset 20 years ago (worse over past year)   SUBJECTIVE:                                                                                                                                                                                           SUBJECTIVE STATEMENT: Did a spin workout Saturday and had a sharp pain when trying to ride out of saddle; knee was fine after stopping. Ran yesterday without  difficulty.  PERTINENT HISTORY:  N/A  PAIN:  Are you having pain? Yes: NPRS scale: 0 currently, up to 2/10 Pain location: Lt knee Pain description: weakness, burning sensation into foot last time she tried to do squats Aggravating factors: squats, lunges, running > 5-6 miles Relieving factors: prednisone, elevating, ice, rest  PRECAUTIONS:  None  RED FLAGS: None   WEIGHT BEARING RESTRICTIONS:  No  FALLS:  Has patient fallen in last 6 months? No  LIVING ENVIRONMENT: Lives  with: lives with their family (65 and 18 y/o children - 78 y/o does have some special need requiring some physical assistance) Lives in: House/apartment Stairs: Yes: Internal: 14 steps; on left going up; some pain and discomfort with descending  OCCUPATION:  Full time: Production designer, theatre/television/film at [employer] (mostly seated work)  PLOF:  Independent and Leisure: [place] concert band, running, reading, knitting, yoga, barre classes, UB strengthening only currently  PATIENT GOALS:  Improve pain and function in Lt knee; work on strengthening   OBJECTIVE:   DIAGNOSTIC FINDINGS:  MRI: 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, high-grade chondromalacia  PATIENT SURVEYS:  07/09/23 FOTO 71 (predicted 78) 08/13/23 FOTO 76  COGNITIVE STATUS: Within functional limits for tasks assessed   SENSATION: WFL  POSTURE:  rounded shoulders and forward head  GAIT: 07/09/23 Comments: independent without significant deviations   PALPATION: 07/09/23 noticeable quad atrophy on Lt    LOWER EXTREMITY ROM:     ROM Right eval Left eval  Knee flexion  A: 132  Knee extension A: 2 A: 2   (Blank rows = not tested)   LOWER EXTREMITY MMT:    MMT Right eval Left eval Left 08/13/23  Knee flexion 5/5 3+/5   Knee extension 59.3# 27.15# 58.5#   (Blank rows = not tested)   TREATMENT:                                                                                                                               DATE:  08/13/23 TherEx SciFit bike L6 x 8 min Discussed continuation of current exercise program as well as monitoring symptoms; current progress and POC; pt verbalized understanding   Manual KT tape to Lt knee on 30% stretch - 2 C curves around patella with lateral to medial anchor strip on 50% stretch Pt performed with instruction for how to perform at home  07/25/23 TherEx Recumbent bike L4 x 8 min Leg press single limb 50# 3x10; bil LLE on 6" step with Rt heel taps 2x10 Lateral lunge with 8# dumbbells x 10 bil Alternating forward lunges with 8# dumbbells x 10 bil Demonstrated TRX lower body exercises with trial reps for gym: squats; lunges (fwd, lateral, curtsy)  Manual KT tape to Lt knee on 30% stretch - 2 C curves around patella with lateral to medial anchor strip on 50% stretch  07/09/23 See HEP - educated on exercises with trial reps performed PRN with min cues for modifications and recommendations     PATIENT EDUCATION:  Education details: HEP Person educated: Patient Education method: Programmer, multimedia, Facilities manager, and Handouts Education comprehension: verbalized understanding, returned demonstration, and needs further education  HOME EXERCISE PROGRAM: Access Code: [email or web address] URL: [email or web address] Date: 07/25/2023 Prepared by: Moshe Cipro  Exercises - Single Leg Knee Extension with Weight Machine  - 1 x daily - 7 x weekly - 3 sets - 10 reps - Single Leg Hamstring Curl with Weight Machine  - 1 x daily -  7 x weekly - 3 sets - 10 reps - Seated Straight Leg Raise  - 1 x daily - 7 x weekly - 3 sets - 10 reps - Single-Leg [place] Deadlift With Kettlebell  - 1 x daily - 7 x weekly - 3 sets - 10 reps - Lateral Step Down  - 1 x daily - 7 x weekly - 3 sets - 10 reps - Lateral Lunge  - 1 x daily - 7 x weekly - 3 sets - 10 reps - Kettlebell Swing  - 1 x daily - 7 x weekly - 3 sets - 10-20 reps - Squat  - 1 x daily - 7 x weekly - 3 sets - 10  reps - Single Leg Squat  - 1 x daily - 7 x weekly - 3 sets - 10 reps - Side Stepping  - 1 x daily - 7 x weekly - 3 sets - 10 reps - Standing Hip Circles at [employer]  - 1 x daily - 7 x weekly - 3 sets - 10 reps - Alternating Forward Lunge  - 1 x daily - 7 x weekly - 3 sets - 10 reps - Criss[place] Jumping Jacks  - 1 x daily - 7 x weekly - 3 sets - 10 reps   ASSESSMENT:  CLINICAL IMPRESSION: Pt overall doing pretty well with PT with extensive exercise program for both home and at the gym.  She is doing well overall meeting most of her LTGs to date.  Plan to hold PT at this time.       OBJECTIVE IMPAIRMENTS: decreased strength and pain.   ACTIVITY LIMITATIONS: carrying, lifting, bending, sitting, standing, squatting, stairs, transfers, and locomotion level  PARTICIPATION LIMITATIONS: meal prep, cleaning, laundry, driving, shopping, community activity, occupation, and yard work  PERSONAL FACTORS: Past/current experiences are also affecting patient's functional outcome.   REHAB POTENTIAL: Good  CLINICAL DECISION MAKING: Stable/uncomplicated  EVALUATION COMPLEXITY: Low   GOALS: Goals reviewed with patient? Yes  SHORT TERM GOALS: Target date: 08/06/2023   Independent with initial HEP Goal status: MET 08/13/23    LONG TERM GOALS: Target date: 10/01/2023  Independent with final HEP Goal status: MET (to date) 08/13/23  2.  FOTO score improved to 78 Goal status: ONGOING 08/13/23  3.  Lt quad strength improved to at least 40# for improved strength and function Goal status: MET 08/13/23  4.  Report pain < 2/10 with weight lifting activities for improved function Goal status: MET 08/13/23    PLAN:  PT FREQUENCY: every other week  PT DURATION: 12 weeks  PLANNED INTERVENTIONS: 97164- PT Re-evaluation, 97110-Therapeutic exercises, 97530- Therapeutic activity, 97112- Neuromuscular re-education, 97535- Self Care, 81191- Manual therapy, [id number]- Gait training, [phone]- Aquatic  Therapy, 97014- Electrical stimulation (unattended), 97016- Vasopneumatic device, Patient/Family education, Balance training, Stair training, Taping, Dry Needling, Cryotherapy, and Moist heat.  PLAN FOR NEXT SESSION: hold PT x 30 days  NEXT MD VISIT: PRN   Clarita Crane, PT, DPT 08/13/23 3:22 PM     PHYSICAL THERAPY DISCHARGE SUMMARY  Visits from Start of Care: 3  Current functional level related to goals / functional outcomes: See above   Remaining deficits: See above   Education / Equipment: HEP   Patient agrees to discharge. Patient goals were partially met. Patient is being discharged due to being pleased with the current functional level.  Clarita Crane, PT, DPT 11/22/23 1:27 PM  [facility] [address] [place], [place], [address] Phone: [phone]  Fax:  [phone]

## 2023-11-21 DIAGNOSIS — Z1151 Encounter for screening for human papillomavirus (HPV): Secondary | ICD-10-CM | POA: Diagnosis not present

## 2023-11-21 DIAGNOSIS — Z1231 Encounter for screening mammogram for malignant neoplasm of breast: Secondary | ICD-10-CM | POA: Diagnosis not present

## 2023-11-21 DIAGNOSIS — Z01419 Encounter for gynecological examination (general) (routine) without abnormal findings: Secondary | ICD-10-CM | POA: Diagnosis not present

## 2023-11-21 DIAGNOSIS — Z124 Encounter for screening for malignant neoplasm of cervix: Secondary | ICD-10-CM | POA: Diagnosis not present

## 2024-01-31 DIAGNOSIS — Z713 Dietary counseling and surveillance: Secondary | ICD-10-CM | POA: Diagnosis not present

## 2024-02-14 DIAGNOSIS — Z713 Dietary counseling and surveillance: Secondary | ICD-10-CM | POA: Diagnosis not present

## 2024-03-05 DIAGNOSIS — Z713 Dietary counseling and surveillance: Secondary | ICD-10-CM | POA: Diagnosis not present

## 2024-03-21 DIAGNOSIS — Z713 Dietary counseling and surveillance: Secondary | ICD-10-CM | POA: Diagnosis not present

## 2024-03-31 DIAGNOSIS — Z713 Dietary counseling and surveillance: Secondary | ICD-10-CM | POA: Diagnosis not present

## 2024-04-11 DIAGNOSIS — H52223 Regular astigmatism, bilateral: Secondary | ICD-10-CM | POA: Diagnosis not present

## 2024-04-17 DIAGNOSIS — Z713 Dietary counseling and surveillance: Secondary | ICD-10-CM | POA: Diagnosis not present

## 2024-05-01 DIAGNOSIS — Z713 Dietary counseling and surveillance: Secondary | ICD-10-CM | POA: Diagnosis not present

## 2024-05-15 DIAGNOSIS — Z713 Dietary counseling and surveillance: Secondary | ICD-10-CM | POA: Diagnosis not present

## 2024-05-27 DIAGNOSIS — Z0189 Encounter for other specified special examinations: Secondary | ICD-10-CM | POA: Diagnosis not present

## 2024-05-28 DIAGNOSIS — R7989 Other specified abnormal findings of blood chemistry: Secondary | ICD-10-CM | POA: Diagnosis not present

## 2024-06-03 DIAGNOSIS — Z23 Encounter for immunization: Secondary | ICD-10-CM | POA: Diagnosis not present

## 2024-06-03 DIAGNOSIS — Z0000 Encounter for general adult medical examination without abnormal findings: Secondary | ICD-10-CM | POA: Diagnosis not present

## 2024-06-03 DIAGNOSIS — Z1339 Encounter for screening examination for other mental health and behavioral disorders: Secondary | ICD-10-CM | POA: Diagnosis not present

## 2024-06-03 DIAGNOSIS — Z1331 Encounter for screening for depression: Secondary | ICD-10-CM | POA: Diagnosis not present

## 2024-06-16 ENCOUNTER — Encounter: Payer: Self-pay | Admitting: Radiology

## 2024-06-20 DIAGNOSIS — Z713 Dietary counseling and surveillance: Secondary | ICD-10-CM | POA: Diagnosis not present

## 2024-07-04 DIAGNOSIS — Z713 Dietary counseling and surveillance: Secondary | ICD-10-CM | POA: Diagnosis not present
